# Patient Record
Sex: Female | Born: 1975 | Race: Black or African American | Hispanic: No | Marital: Married | Smoking: Current every day smoker
Health system: Southern US, Community
[De-identification: ages and names within clinical notes are randomized; demographics above are authoritative.]

## PROBLEM LIST (undated history)

## (undated) DIAGNOSIS — R7611 Nonspecific reaction to tuberculin skin test without active tuberculosis: Secondary | ICD-10-CM

## (undated) DIAGNOSIS — N200 Calculus of kidney: Secondary | ICD-10-CM

## (undated) DIAGNOSIS — J42 Unspecified chronic bronchitis: Secondary | ICD-10-CM

## (undated) DIAGNOSIS — J45909 Unspecified asthma, uncomplicated: Secondary | ICD-10-CM

## (undated) DIAGNOSIS — M549 Dorsalgia, unspecified: Secondary | ICD-10-CM

## (undated) DIAGNOSIS — K219 Gastro-esophageal reflux disease without esophagitis: Secondary | ICD-10-CM

## (undated) DIAGNOSIS — M1990 Unspecified osteoarthritis, unspecified site: Secondary | ICD-10-CM

## (undated) DIAGNOSIS — F329 Major depressive disorder, single episode, unspecified: Secondary | ICD-10-CM

## (undated) DIAGNOSIS — M797 Fibromyalgia: Secondary | ICD-10-CM

## (undated) DIAGNOSIS — K8020 Calculus of gallbladder without cholecystitis without obstruction: Secondary | ICD-10-CM

## (undated) DIAGNOSIS — F32A Depression, unspecified: Secondary | ICD-10-CM

## (undated) DIAGNOSIS — G8929 Other chronic pain: Secondary | ICD-10-CM

## (undated) DIAGNOSIS — Z9289 Personal history of other medical treatment: Secondary | ICD-10-CM

## (undated) DIAGNOSIS — G43909 Migraine, unspecified, not intractable, without status migrainosus: Secondary | ICD-10-CM

## (undated) DIAGNOSIS — IMO0002 Reserved for concepts with insufficient information to code with codable children: Secondary | ICD-10-CM

## (undated) DIAGNOSIS — E785 Hyperlipidemia, unspecified: Secondary | ICD-10-CM

## (undated) DIAGNOSIS — I10 Essential (primary) hypertension: Secondary | ICD-10-CM

## (undated) DIAGNOSIS — E119 Type 2 diabetes mellitus without complications: Secondary | ICD-10-CM

## (undated) HISTORY — PX: LITHOTRIPSY: SUR834

## (undated) HISTORY — PX: CERVICAL CERCLAGE: SHX1329

## (undated) HISTORY — PX: WISDOM TOOTH EXTRACTION: SHX21

## (undated) HISTORY — PX: COLONOSCOPY: SHX174

## (undated) HISTORY — PX: ESOPHAGOGASTRODUODENOSCOPY: SHX1529

## (undated) HISTORY — PX: DILATION AND CURETTAGE OF UTERUS: SHX78

## (undated) HISTORY — DX: Calculus of gallbladder without cholecystitis without obstruction: K80.20

---

## 1995-05-03 HISTORY — PX: LAPAROSCOPIC CHOLECYSTECTOMY: SUR755

## 1997-05-02 HISTORY — PX: TUBAL LIGATION: SHX77

## 1997-05-02 HISTORY — PX: CENTRAL VENOUS CATHETER INSERTION: SHX401

## 1997-06-30 HISTORY — PX: CENTRAL VENOUS CATHETER REMOVAL: SHX1323

## 2011-03-04 ENCOUNTER — Emergency Department (HOSPITAL_BASED_OUTPATIENT_CLINIC_OR_DEPARTMENT_OTHER)
Admission: EM | Admit: 2011-03-04 | Discharge: 2011-03-04 | Disposition: A | Discharge status: Home / Self Care | Payer: PRIVATE HEALTH INSURANCE | Attending: Emergency Medicine | Admitting: Emergency Medicine

## 2011-03-04 ENCOUNTER — Emergency Department (INDEPENDENT_AMBULATORY_CARE_PROVIDER_SITE_OTHER): Payer: PRIVATE HEALTH INSURANCE

## 2011-03-04 DIAGNOSIS — S6390XA Sprain of unspecified part of unspecified wrist and hand, initial encounter: Secondary | ICD-10-CM | POA: Insufficient documentation

## 2011-03-04 DIAGNOSIS — S6392XA Sprain of unspecified part of left wrist and hand, initial encounter: Secondary | ICD-10-CM

## 2011-03-04 DIAGNOSIS — Y990 Civilian activity done for income or pay: Secondary | ICD-10-CM | POA: Insufficient documentation

## 2011-03-04 DIAGNOSIS — E119 Type 2 diabetes mellitus without complications: Secondary | ICD-10-CM | POA: Insufficient documentation

## 2011-03-04 DIAGNOSIS — W230XXA Caught, crushed, jammed, or pinched between moving objects, initial encounter: Secondary | ICD-10-CM | POA: Insufficient documentation

## 2011-03-04 DIAGNOSIS — E785 Hyperlipidemia, unspecified: Secondary | ICD-10-CM | POA: Insufficient documentation

## 2011-03-04 DIAGNOSIS — M79609 Pain in unspecified limb: Secondary | ICD-10-CM | POA: Insufficient documentation

## 2011-03-04 DIAGNOSIS — Y9289 Other specified places as the place of occurrence of the external cause: Secondary | ICD-10-CM | POA: Insufficient documentation

## 2011-03-04 DIAGNOSIS — I10 Essential (primary) hypertension: Secondary | ICD-10-CM | POA: Insufficient documentation

## 2011-03-04 DIAGNOSIS — S6990XA Unspecified injury of unspecified wrist, hand and finger(s), initial encounter: Secondary | ICD-10-CM

## 2011-03-04 DIAGNOSIS — X58XXXA Exposure to other specified factors, initial encounter: Secondary | ICD-10-CM

## 2011-03-04 HISTORY — DX: Hyperlipidemia, unspecified: E78.5

## 2011-03-04 HISTORY — DX: Essential (primary) hypertension: I10

## 2011-03-04 MED ORDER — OXYCODONE-ACETAMINOPHEN 5-325 MG PO TABS: 1.0000 | ORAL_TABLET | ORAL | Status: AC | PRN

## 2011-03-04 MED ORDER — OXYCODONE-ACETAMINOPHEN 5-325 MG PO TABS
1.0000 | ORAL_TABLET | Freq: Once | ORAL | Status: AC
  Administered 2011-03-04: 1 via ORAL
  Filled 2011-03-04: qty 1

## 2011-03-04 NOTE — ED Notes (Signed)
MD at bedside. 

## 2011-03-04 NOTE — ED Provider Notes (Signed)
History     CSN: [id number] Arrival date & time: 03/04/2011  7:37 AM   First MD Initiated Contact with Patient 03/04/11 0725      Chief Complaint  Patient presents with  . Hand Injury    (Consider location/radiation/quality/duration/timing/severity/associated sxs/prior treatment) Patient is a 35 y.o. female presenting with hand injury. The history is provided by the patient.  Hand Injury  Incident onset: last night. The incident occurred at work. Injury mechanism: "may have jammed the hand" The pain is present in the left hand. The quality of the pain is described as aching. The pain is moderate. The pain has been constant since the incident.   patient reports that she may have injured her hand last night at work she patient may jammed the hand in the door she reports the pain is getting worse in the past several hours  Past Medical History  Diagnosis Date  . Diabetes mellitus   . Hypertension   . Hyperlipemia     Past Surgical History  Procedure Date  . Peripherally inserted central catheter insertion   . Cervical cerclage   . Cholecystectomy   . Cesarean section   . Tubal ligation     No family history on file.  History  Substance Use Topics  . Smoking status: Current Everyday Smoker -- 0.5 packs/day  . Smokeless tobacco: Never Used  . Alcohol Use: Yes     occasional    OB History    Grav Para Term Preterm Abortions TAB SAB Ect Mult Living                  Review of Systems  Musculoskeletal: Negative for back pain.  Neurological: Negative for weakness.    Allergies  Macrolides and ketolides and Penicillins  Home Medications  No current outpatient prescriptions on file.  BP 156/102  Pulse 83  Temp(Src) 98.3 F (36.8 C) (Oral)  Resp 16  Ht 5\' 1"  (1.549 m)  Wt 168 lb (76.204 kg)  BMI 31.74 kg/m2  SpO2 100%  LMP 02/04/2011  Physical Exam  CONSTITUTIONAL: Well developed/well nourished HEAD AND FACE: Normocephalic/atraumatic EYES: EOMI ENMT:  Mucous membranes moist NECK: supple no meningeal signs LUNGS:, no apparent distress ABDOMEN: soft NEURO: Pt is awake/alert, moves all extremitiesx4, distal motor/sensory intact on left hand EXTREMITIES: pulses normal, full ROM, full ROM of left wrist, she is able to make fist with left hand, no erythema/bruising noted.  Tenderness to dorsal aspect of left hand SKIN: warm, color normal PSYCH: no abnormalities of mood noted   ED Course  Procedures (including critical care time)    MDM  Nursing notes reviewed and considered in documentation xrays reviewed and considered   Re-examine, she has full abd/adduction of left thumb, she can fully flex/extend the left thumb and there is no tenderness and no deformity She is requesting hand splint/buddy tape         Joya Gaskins, MD 03/04/11 [phone]

## 2011-03-04 NOTE — ED Notes (Signed)
Injury to left hand 

## 2011-03-07 NOTE — ED Notes (Signed)
Pt called to clarify return to work date. Assisted with date. No other questions at this time.

## 2011-05-13 DIAGNOSIS — IMO0002 Reserved for concepts with insufficient information to code with codable children: Secondary | ICD-10-CM | POA: Insufficient documentation

## 2011-05-13 DIAGNOSIS — E1165 Type 2 diabetes mellitus with hyperglycemia: Secondary | ICD-10-CM | POA: Insufficient documentation

## 2012-10-30 HISTORY — PX: ABDOMINAL HYSTERECTOMY: SHX81

## 2013-03-13 ENCOUNTER — Emergency Department (HOSPITAL_BASED_OUTPATIENT_CLINIC_OR_DEPARTMENT_OTHER): Payer: Self-pay

## 2013-03-13 ENCOUNTER — Encounter (HOSPITAL_BASED_OUTPATIENT_CLINIC_OR_DEPARTMENT_OTHER): Payer: Self-pay | Admitting: Emergency Medicine

## 2013-03-13 ENCOUNTER — Emergency Department (HOSPITAL_BASED_OUTPATIENT_CLINIC_OR_DEPARTMENT_OTHER)
Admission: EM | Admit: 2013-03-13 | Discharge: 2013-03-13 | Disposition: A | Discharge status: Home / Self Care | Payer: Self-pay | Attending: Emergency Medicine | Admitting: Emergency Medicine

## 2013-03-13 DIAGNOSIS — F17200 Nicotine dependence, unspecified, uncomplicated: Secondary | ICD-10-CM | POA: Insufficient documentation

## 2013-03-13 DIAGNOSIS — E119 Type 2 diabetes mellitus without complications: Secondary | ICD-10-CM | POA: Insufficient documentation

## 2013-03-13 DIAGNOSIS — R0789 Other chest pain: Secondary | ICD-10-CM | POA: Insufficient documentation

## 2013-03-13 DIAGNOSIS — R739 Hyperglycemia, unspecified: Secondary | ICD-10-CM

## 2013-03-13 DIAGNOSIS — Z880 Allergy status to penicillin: Secondary | ICD-10-CM | POA: Insufficient documentation

## 2013-03-13 DIAGNOSIS — R000 Tachycardia, unspecified: Secondary | ICD-10-CM | POA: Insufficient documentation

## 2013-03-13 DIAGNOSIS — Z3202 Encounter for pregnancy test, result negative: Secondary | ICD-10-CM | POA: Insufficient documentation

## 2013-03-13 DIAGNOSIS — Z8719 Personal history of other diseases of the digestive system: Secondary | ICD-10-CM | POA: Insufficient documentation

## 2013-03-13 DIAGNOSIS — R079 Chest pain, unspecified: Secondary | ICD-10-CM

## 2013-03-13 DIAGNOSIS — I10 Essential (primary) hypertension: Secondary | ICD-10-CM | POA: Insufficient documentation

## 2013-03-13 LAB — BASIC METABOLIC PANEL
BUN: 8 mg/dL (ref 6–23)
CO2: 25 mEq/L (ref 19–32)
Calcium: 9.1 mg/dL (ref 8.4–10.5)
Chloride: 96 mEq/L (ref 96–112)
Creatinine, Ser: 0.6 mg/dL (ref 0.50–1.10)
GFR calc Af Amer: >90 mL/min (ref >90)
GFR calc non Af Amer: >90 mL/min (ref >90)
Glucose, Bld: 344 mg/dL — ABNORMAL HIGH (ref 70–99)
Potassium: 3.5 mEq/L (ref 3.5–5.1)
Sodium: 132 mEq/L — ABNORMAL LOW (ref 135–145)

## 2013-03-13 LAB — CBC WITH DIFFERENTIAL/PLATELET
Basophils Absolute: 0 10*3/uL (ref 0.0–0.1)
Basophils Relative: 0 % (ref 0–1)
Eosinophils Absolute: 0.4 10*3/uL (ref 0.0–0.7)
Eosinophils Relative: 4 % (ref 0–5)
HCT: 40.5 % (ref 36.0–46.0)
Hemoglobin: 13.6 g/dL (ref 12.0–15.0)
Lymphocytes Relative: 35 % (ref 12–46)
Lymphs Abs: 3.3 10*3/uL (ref 0.7–4.0)
MCH: 24.3 pg — ABNORMAL LOW (ref 26.0–34.0)
MCHC: 33.6 g/dL (ref 30.0–36.0)
MCV: 72.3 fL — ABNORMAL LOW (ref 78.0–100.0)
Monocytes Absolute: 0.6 10*3/uL (ref 0.1–1.0)
Monocytes Relative: 6 % (ref 3–12)
Neutro Abs: 5.1 10*3/uL (ref 1.7–7.7)
Neutrophils Relative %: 55 % (ref 43–77)
Platelets: 319 10*3/uL (ref 150–400)
RBC: 5.6 MIL/uL — ABNORMAL HIGH (ref 3.87–5.11)
RDW: 14 % (ref 11.5–15.5)
WBC: 9.4 10*3/uL (ref 4.0–10.5)

## 2013-03-13 LAB — TROPONIN I: Troponin I: <0.3 ng/mL (ref <0.30)

## 2013-03-13 LAB — PREGNANCY, URINE: Preg Test, Ur: NEGATIVE

## 2013-03-13 MED ORDER — SODIUM CHLORIDE 0.9 % IV BOLUS (SEPSIS): 1000.0000 mL | Freq: Once | INTRAVENOUS | Status: DC

## 2013-03-13 MED ORDER — MORPHINE SULFATE 4 MG/ML IJ SOLN
4.0000 mg | Freq: Once | INTRAMUSCULAR | Status: AC
  Administered 2013-03-13: 4 mg via INTRAVENOUS
  Filled 2013-03-13: qty 1

## 2013-03-13 MED ORDER — SODIUM CHLORIDE 0.9 % IV BOLUS (SEPSIS)
1000.0000 mL | Freq: Once | INTRAVENOUS | Status: AC
  Administered 2013-03-13: 1000 mL via INTRAVENOUS

## 2013-03-13 MED ORDER — HYDROCODONE-ACETAMINOPHEN 5-325 MG PO TABS: 1.0000 | ORAL_TABLET | ORAL | Status: DC | PRN

## 2013-03-13 MED ORDER — ASPIRIN 81 MG PO CHEW
324.0000 mg | CHEWABLE_TABLET | Freq: Once | ORAL | Status: AC
  Administered 2013-03-13: 324 mg via ORAL
  Filled 2013-03-13: qty 4

## 2013-03-13 MED ORDER — IOHEXOL 350 MG/ML SOLN
80.0000 mL | Freq: Once | INTRAVENOUS | Status: AC | PRN
  Administered 2013-03-13: 80 mL via INTRAVENOUS

## 2013-03-13 NOTE — ED Notes (Signed)
Pt states that she is concerned that she cannot afford the insulin pen her doctor prescribed for her. States her doctor put her on a new "diabetic pill" because she can't afford the insulin pen, and she doesn't feel that it is working as well to control her diabetes. Pt states she cannot afford her copay and deductible to see her pcp again to discuss this. Pt given printed information regarding the [facility] in [place]. Pt states it would be worth the drive to [place] to get some help in managing her diabetes. Pt and husband verbalize understanding of importance of keeping her blood sugars under control.

## 2013-03-13 NOTE — ED Provider Notes (Signed)
CSN: [id number]     Arrival date & time 03/13/13  1049 History   First MD Initiated Contact with Patient 03/13/13 1053     Chief Complaint  Patient presents with  . Chest Pain   (Consider location/radiation/quality/duration/timing/severity/associated sxs/prior Treatment) HPI Comments: 37 year old female presents with about 30 hours of constant chest pressure. States the pain seems to radiate up to her shoulder into her left arm. The pain is severe and constant in nature. It does get worse with inspiration and movement of her left arm. She is also no sig as low but worse with sitting up. Has not been sick recently. Denies any cough or URI symptoms. Had a lobe of intermittent nausea but none currently. No abdominal pain or vomiting. States is not worse with exertion. There is no dyspnea. She has not had any asymmetric leg swelling, hemoptysis, or surgery within the last month. She states her mom has had a history of a blood clot but they do not know why. Has tried NSAIDs without any relief.   Past Medical History  Diagnosis Date  . Diabetes mellitus   . Hypertension   . Hyperlipemia   . Reflux    Past Surgical History  Procedure Laterality Date  . Peripherally inserted central catheter insertion    . Cervical cerclage    . Cholecystectomy    . Cesarean section    . Tubal ligation    . Abdominal hysterectomy     No family history on file. History  Substance Use Topics  . Smoking status: Current Every Day Smoker -- 0.50 packs/day  . Smokeless tobacco: Never Used  . Alcohol Use: Yes     Comment: occasional   OB History   Grav Para Term Preterm Abortions TAB SAB Ect Mult Living                 Review of Systems  Constitutional: Negative for fever, chills and diaphoresis.  HENT: Negative for congestion.   Respiratory: Negative for cough and shortness of breath.   Cardiovascular: Positive for chest pain. Negative for leg swelling.  Gastrointestinal: Negative for vomiting and  abdominal pain.  Musculoskeletal: Negative for back pain.  Neurological: Negative for weakness and numbness.  All other systems reviewed and are negative.    Allergies  Macrolides and ketolides; Penicillins; and Amoxicillin  Home Medications   Current Outpatient Rx  Name  Route  Sig  Dispense  Refill  . UNABLE TO FIND      Med Name: *          BP 140/100  Temp(Src) 98.2 F (36.8 C) (Oral)  Resp 20  SpO2 99%  LMP 02/04/2011 Physical Exam  Nursing note and vitals reviewed. Constitutional: She is oriented to person, place, and time. She appears well-developed and well-nourished. No distress.  HENT:  Head: Normocephalic and atraumatic.  Right Ear: External ear normal.  Left Ear: External ear normal.  Nose: Nose normal.  Mouth/Throat: Oropharynx is clear and moist.  Neck: Neck supple.  Cardiovascular: Regular rhythm, normal heart sounds and intact distal pulses.  Tachycardia present.   Pulses:      Radial pulses are 2+ on the right side, and 2+ on the left side.  Pulmonary/Chest: Effort normal and breath sounds normal. She has no wheezes. She has no rales. She exhibits tenderness.  Abdominal: Soft. She exhibits no distension. There is no tenderness.  Musculoskeletal: She exhibits no edema and no tenderness.  Neurological: She is alert and oriented to person, place,  and time.  Skin: Skin is warm and dry. No rash noted. She is not diaphoretic.    ED Course  Procedures (including critical care time) Labs Review Labs Reviewed  CBC WITH DIFFERENTIAL - Abnormal; Notable for the following:    RBC 5.60 (*)    MCV 72.3 (*)    MCH 24.3 (*)    All other components within normal limits  BASIC METABOLIC PANEL - Abnormal; Notable for the following:    Sodium 132 (*)    Glucose, Bld 344 (*)    All other components within normal limits  TROPONIN I  PREGNANCY, URINE   Imaging Review Dg Chest 2 View  03/13/2013   CLINICAL DATA:  Left chest and left arm discomfort. History  of tobacco use, hypertension, and diabetes.  EXAM: CHEST  2 VIEW  COMPARISON:  July 16, 2010  FINDINGS: The lungs are adequately inflated. There is no focal infiltrate. There are coarse perihilar lung markings on the right which are not entirely new when compared to the study of July 16, 2010. The cardiac silhouette is normal in size. The mediastinum is normal in width. There is no pleural effusion or pneumothorax.  IMPRESSION: There is no evidence of pneumonia or CHF. Right perihilar lung markings are slightly more conspicuous today than in the past. This may reflect subsegmental atelectasis in the appropriate clinical setting.   Electronically Signed   By: David  [place]   On: 03/13/2013 11:58   Ct Angio Chest Pe W/cm &/or Wo Cm  03/13/2013   CLINICAL DATA:  Chest pain and shortness of breath.  EXAM: CT ANGIOGRAPHY CHEST WITH CONTRAST  TECHNIQUE: Multidetector CT imaging of the chest was performed using the standard protocol during bolus administration of intravenous contrast. Multiplanar CT image reconstructions including MIPs were obtained to evaluate the vascular anatomy.  CONTRAST:  80 mL OMNIPAQUE IOHEXOL 350 MG/ML SOLN  COMPARISON:  Plain film of the chest 03/13/2013 11:51 a.m.  FINDINGS: No pulmonary embolus is identified. There is no axillary, hilar or mediastinal lymphadenopathy. Heart size is normal. No pleural or pericardial effusion. Small hiatal hernia is noted. The lungs demonstrate only some mild dependent atelectatic change. Incidentally imaged upper abdomen is unremarkable. No focal bony abnormality is seen.  Review of the MIP images confirms the above findings.  IMPRESSION: Negative for pulmonary embolus.  Negative examination.   Electronically Signed   By: Drusilla Kanner M.D.   On: 03/13/2013 12:49    EKG Interpretation     Ventricular Rate:  120 PR Interval:  142 QRS Duration: 62 QT Interval:  322 QTC Calculation: 455 R Axis:   54 Text Interpretation:  Sinus tachycardia  Cannot rule out Anterior infarct , age undetermined artifact limits interpretation No old tracing to compare            MDM   1. Chest pain   2. Hyperglycemia    Patient's EKG shows no acute ischemia. Repeat EKG also benign. She has a normal troponin, and with over 24 hours of constant symptoms and feels like a was not necessary to rule out MI. Moderate suspicion for PE lead to a CT scan which is negative for PE or other acute chest pathology. Her HEART score is a 3, which puts her in a low risk category. Given that she has risk factors and a nonspecific chest pain I recommended that she needs a stress test within the next few days. I offered observation in the hospital but the patient states if she  can do it outpatient she preferred this. I discussed strict return precautions with the patient and feel that outpatient management is okay at this time given her low-risk status. She understands return precautions and will call her PCP immediately after discharge to set up close outpatient workup.    Audree Camel, MD 03/13/13 1343

## 2013-03-13 NOTE — ED Notes (Signed)
Patient states she woke up yesterday morning at 0630 with left chest pain with radiation into her left arm. Describes pain as pressure and constant.  Using OTC ibuprofen and mylanta with no relief.

## 2013-03-13 NOTE — ED Notes (Signed)
md at bedside, pt to stay in dept. Until second liter of ns hung and infused.

## 2013-03-31 ENCOUNTER — Emergency Department (HOSPITAL_BASED_OUTPATIENT_CLINIC_OR_DEPARTMENT_OTHER)
Admission: EM | Admit: 2013-03-31 | Discharge: 2013-03-31 | Disposition: A | Discharge status: Home / Self Care | Payer: PRIVATE HEALTH INSURANCE | Attending: Emergency Medicine | Admitting: Emergency Medicine

## 2013-03-31 ENCOUNTER — Emergency Department (HOSPITAL_BASED_OUTPATIENT_CLINIC_OR_DEPARTMENT_OTHER): Payer: PRIVATE HEALTH INSURANCE

## 2013-03-31 ENCOUNTER — Encounter (HOSPITAL_BASED_OUTPATIENT_CLINIC_OR_DEPARTMENT_OTHER): Payer: Self-pay | Admitting: Emergency Medicine

## 2013-03-31 DIAGNOSIS — I10 Essential (primary) hypertension: Secondary | ICD-10-CM | POA: Insufficient documentation

## 2013-03-31 DIAGNOSIS — Z794 Long term (current) use of insulin: Secondary | ICD-10-CM | POA: Insufficient documentation

## 2013-03-31 DIAGNOSIS — Z880 Allergy status to penicillin: Secondary | ICD-10-CM | POA: Insufficient documentation

## 2013-03-31 DIAGNOSIS — J029 Acute pharyngitis, unspecified: Secondary | ICD-10-CM | POA: Insufficient documentation

## 2013-03-31 DIAGNOSIS — F17200 Nicotine dependence, unspecified, uncomplicated: Secondary | ICD-10-CM | POA: Insufficient documentation

## 2013-03-31 DIAGNOSIS — IMO0001 Reserved for inherently not codable concepts without codable children: Secondary | ICD-10-CM | POA: Insufficient documentation

## 2013-03-31 DIAGNOSIS — R0789 Other chest pain: Secondary | ICD-10-CM

## 2013-03-31 DIAGNOSIS — Z79899 Other long term (current) drug therapy: Secondary | ICD-10-CM | POA: Insufficient documentation

## 2013-03-31 DIAGNOSIS — R071 Chest pain on breathing: Secondary | ICD-10-CM | POA: Insufficient documentation

## 2013-03-31 DIAGNOSIS — B9789 Other viral agents as the cause of diseases classified elsewhere: Secondary | ICD-10-CM | POA: Insufficient documentation

## 2013-03-31 DIAGNOSIS — E119 Type 2 diabetes mellitus without complications: Secondary | ICD-10-CM | POA: Insufficient documentation

## 2013-03-31 DIAGNOSIS — Z8719 Personal history of other diseases of the digestive system: Secondary | ICD-10-CM | POA: Insufficient documentation

## 2013-03-31 DIAGNOSIS — B349 Viral infection, unspecified: Secondary | ICD-10-CM

## 2013-03-31 LAB — CBC WITH DIFFERENTIAL/PLATELET
Basophils Absolute: 0 10*3/uL (ref 0.0–0.1)
Basophils Relative: 0 % (ref 0–1)
Eosinophils Absolute: 0.9 10*3/uL — ABNORMAL HIGH (ref 0.0–0.7)
Eosinophils Relative: 12 % — ABNORMAL HIGH (ref 0–5)
HCT: 40.2 % (ref 36.0–46.0)
Hemoglobin: 13.3 g/dL (ref 12.0–15.0)
Lymphocytes Relative: 30 % (ref 12–46)
Lymphs Abs: 2.3 10*3/uL (ref 0.7–4.0)
MCH: 23.9 pg — ABNORMAL LOW (ref 26.0–34.0)
MCHC: 33.1 g/dL (ref 30.0–36.0)
MCV: 72.3 fL — ABNORMAL LOW (ref 78.0–100.0)
Monocytes Absolute: 0.7 10*3/uL (ref 0.1–1.0)
Monocytes Relative: 9 % (ref 3–12)
Neutro Abs: 3.9 10*3/uL (ref 1.7–7.7)
Neutrophils Relative %: 49 % (ref 43–77)
Platelets: 368 10*3/uL (ref 150–400)
RBC: 5.56 MIL/uL — ABNORMAL HIGH (ref 3.87–5.11)
RDW: 13.8 % (ref 11.5–15.5)
WBC: 7.8 10*3/uL (ref 4.0–10.5)

## 2013-03-31 LAB — BASIC METABOLIC PANEL
BUN: 7 mg/dL (ref 6–23)
CO2: 23 mEq/L (ref 19–32)
Calcium: 9 mg/dL (ref 8.4–10.5)
Chloride: 101 mEq/L (ref 96–112)
Creatinine, Ser: 0.6 mg/dL (ref 0.50–1.10)
GFR calc Af Amer: >90 mL/min (ref >90)
GFR calc non Af Amer: >90 mL/min (ref >90)
Glucose, Bld: 164 mg/dL — ABNORMAL HIGH (ref 70–99)
Potassium: 4.4 mEq/L (ref 3.5–5.1)
Sodium: 136 mEq/L (ref 135–145)

## 2013-03-31 LAB — TROPONIN I: Troponin I: <0.3 ng/mL (ref <0.30)

## 2013-03-31 MED ORDER — ACETAMINOPHEN 325 MG PO TABS
650.0000 mg | ORAL_TABLET | Freq: Once | ORAL | Status: AC
  Administered 2013-03-31: 650 mg via ORAL
  Filled 2013-03-31: qty 2

## 2013-03-31 MED ORDER — SODIUM CHLORIDE 0.9 % IV SOLN
1000.0000 mL | Freq: Once | INTRAVENOUS | Status: AC
  Administered 2013-03-31: 1000 mL via INTRAVENOUS

## 2013-03-31 NOTE — ED Notes (Signed)
Patient reports cough and congestion x 4 days. States that her chest hurst with any movement and cough. Describes as productive with green sputum. Low grade fever for same

## 2013-04-04 NOTE — ED Provider Notes (Signed)
CSN: [id number]     Arrival date & time 03/31/13  1025 History   First MD Initiated Contact with Patient 03/31/13 1057     Chief Complaint  Patient presents with  . Cough  . Nasal Congestion  . Chest Pain   (Consider location/radiation/quality/duration/timing/severity/associated sxs/prior Treatment) Patient is a 37 y.o. female presenting with cough and chest pain. The history is provided by the patient. No language interpreter was used.  Cough Cough characteristics:  Non-productive Severity:  Moderate Onset quality:  Gradual Duration:  2 days Timing:  Intermittent Progression:  Unchanged Chronicity:  New Smoker: no   Context: sick contacts   Context: not animal exposure, not exposure to allergens and not fumes   Context comment:  Daughter with similar symptoms in department.  Relieved by:  None tried Ineffective treatments:  None tried Associated symptoms: chest pain, chills, fever, headaches, myalgias, rhinorrhea, sinus congestion and sore throat   Associated symptoms: no diaphoresis, no eye discharge, no rash, no shortness of breath, no weight loss and no wheezing   Chest pain:    Quality:  Sharp   Severity:  Moderate   Onset quality:  Gradual (Pain occures with coughing. )   Chronicity:  New Risk factors: no chemical exposure and no recent travel   Chest Pain Associated symptoms: cough, fever and headache   Associated symptoms: no diaphoresis and no shortness of breath     Past Medical History  Diagnosis Date  . Diabetes mellitus   . Hypertension   . Hyperlipemia   . Reflux    Past Surgical History  Procedure Laterality Date  . Peripherally inserted central catheter insertion    . Cervical cerclage    . Cholecystectomy    . Cesarean section    . Tubal ligation    . Abdominal hysterectomy     No family history on file. History  Substance Use Topics  . Smoking status: Current Every Day Smoker -- 0.50 packs/day  . Smokeless tobacco: Never Used  . Alcohol  Use: Yes     Comment: occasional   OB History   Grav Para Term Preterm Abortions TAB SAB Ect Mult Living                 Review of Systems  Constitutional: Positive for fever and chills. Negative for weight loss and diaphoresis.  HENT: Positive for rhinorrhea and sore throat.   Eyes: Negative for discharge.  Respiratory: Positive for cough. Negative for shortness of breath and wheezing.   Cardiovascular: Positive for chest pain.  Musculoskeletal: Positive for myalgias.  Skin: Negative for rash.  Neurological: Positive for headaches.  All other systems reviewed and are negative.    Allergies  Macrolides and ketolides; Penicillins; and Amoxicillin  Home Medications   Current Outpatient Rx  Name  Route  Sig  Dispense  Refill  . insulin NPH-regular (NOVOLIN 70/30) (70-30) 100 UNIT/ML injection   Subcutaneous   Inject 10 Units into the skin 2 (two) times daily with a meal.         . lisinopril (PRINIVIL,ZESTRIL) 20 MG tablet   Oral   Take 20 mg by mouth daily.          BP 127/83  Pulse 79  Temp(Src) 97.6 F (36.4 C) (Oral)  Resp 22  Wt 150 lb (68.04 kg)  SpO2 100%  LMP 02/04/2011 Physical Exam  Nursing note and vitals reviewed. Constitutional: She is oriented to person, place, and time. She appears well-developed and well-nourished.  HENT:  Head: Normocephalic and atraumatic.  Right Ear: External ear normal.  Left Ear: External ear normal.  Nose: Nose normal.  Mouth/Throat: Oropharynx is clear and moist.  Eyes: Conjunctivae and EOM are normal. Pupils are equal, round, and reactive to light.  Neck: Normal range of motion. Neck supple. No JVD present. No tracheal deviation present. No thyromegaly present.  Cardiovascular: Normal rate, regular rhythm, normal heart sounds and intact distal pulses.   Chest ttp lower sternum  Pulmonary/Chest: Effort normal and breath sounds normal. No respiratory distress. She has no wheezes.  Abdominal: Soft. Bowel sounds are  normal. She exhibits no mass. There is no tenderness. There is no guarding.  Musculoskeletal: Normal range of motion.  Lymphadenopathy:    She has no cervical adenopathy.  Neurological: She is alert and oriented to person, place, and time. She has normal reflexes. No cranial nerve deficit or sensory deficit. Gait normal. GCS eye subscore is 4. GCS verbal subscore is 5. GCS motor subscore is 6.  Reflex Scores:      Bicep reflexes are 2+ on the right side and 2+ on the left side.      Patellar reflexes are 2+ on the right side and 2+ on the left side. Strength is 5/5 bilateral elbow flexor/extensors, wrist extension/flexion, intrinsic hand strength equal Bilateral hip flexion/extension 5/5, knee flexion/extension 5/5, ankle 5/5 flexion extension    Skin: Skin is warm and dry.  Psychiatric: She has a normal mood and affect. Her behavior is normal. Judgment and thought content normal.    ED Course  Procedures (including critical care time) Labs Review Labs Reviewed  CBC WITH DIFFERENTIAL - Abnormal; Notable for the following:    RBC 5.56 (*)    MCV 72.3 (*)    MCH 23.9 (*)    Eosinophils Relative 12 (*)    Eosinophils Absolute 0.9 (*)    All other components within normal limits  BASIC METABOLIC PANEL - Abnormal; Notable for the following:    Glucose, Bld 164 (*)    All other components within normal limits  TROPONIN I   Imaging Review No results found.  EKG Interpretation    Date/Time:  Sunday March 31 2013 11:09:39 EST Ventricular Rate:  97 PR Interval:  150 QRS Duration: 78 QT Interval:  362 QTC Calculation: 459 R Axis:   72 Text Interpretation:  Normal sinus rhythm Normal ECG Confirmed by Keyly Baldonado MD, Malosi Hemstreet ([id number]) on 03/31/2013 11:12:54 AM            MDM   1. Viral syndrome   2. Chest wall pain    Patient is a 37 y.o. iddm present today with viral syndrome and chest wall pain.  Labs normal except bs elevated at 164.  No evidence of dka.  Patient hydrated  and assessed with labs, ekg, and cxr without evidence of mi or cap.     Hilario Quarry, MD 04/04/13 1226

## 2013-08-29 ENCOUNTER — Emergency Department (HOSPITAL_BASED_OUTPATIENT_CLINIC_OR_DEPARTMENT_OTHER)
Admission: EM | Admit: 2013-08-29 | Discharge: 2013-08-29 | Disposition: A | Discharge status: Home / Self Care | Payer: Medicaid Other | Attending: Emergency Medicine | Admitting: Emergency Medicine

## 2013-08-29 ENCOUNTER — Encounter (HOSPITAL_BASED_OUTPATIENT_CLINIC_OR_DEPARTMENT_OTHER): Payer: Self-pay | Admitting: Emergency Medicine

## 2013-08-29 DIAGNOSIS — Z880 Allergy status to penicillin: Secondary | ICD-10-CM | POA: Insufficient documentation

## 2013-08-29 DIAGNOSIS — Z79899 Other long term (current) drug therapy: Secondary | ICD-10-CM | POA: Insufficient documentation

## 2013-08-29 DIAGNOSIS — E119 Type 2 diabetes mellitus without complications: Secondary | ICD-10-CM | POA: Insufficient documentation

## 2013-08-29 DIAGNOSIS — K219 Gastro-esophageal reflux disease without esophagitis: Secondary | ICD-10-CM | POA: Insufficient documentation

## 2013-08-29 DIAGNOSIS — Z794 Long term (current) use of insulin: Secondary | ICD-10-CM | POA: Insufficient documentation

## 2013-08-29 DIAGNOSIS — IMO0002 Reserved for concepts with insufficient information to code with codable children: Secondary | ICD-10-CM | POA: Insufficient documentation

## 2013-08-29 DIAGNOSIS — IMO0001 Reserved for inherently not codable concepts without codable children: Secondary | ICD-10-CM | POA: Insufficient documentation

## 2013-08-29 DIAGNOSIS — F17200 Nicotine dependence, unspecified, uncomplicated: Secondary | ICD-10-CM | POA: Insufficient documentation

## 2013-08-29 DIAGNOSIS — I10 Essential (primary) hypertension: Secondary | ICD-10-CM | POA: Insufficient documentation

## 2013-08-29 DIAGNOSIS — G8929 Other chronic pain: Secondary | ICD-10-CM | POA: Insufficient documentation

## 2013-08-29 DIAGNOSIS — M5410 Radiculopathy, site unspecified: Secondary | ICD-10-CM

## 2013-08-29 HISTORY — DX: Fibromyalgia: M79.7

## 2013-08-29 HISTORY — DX: Reserved for concepts with insufficient information to code with codable children: IMO0002

## 2013-08-29 MED ORDER — KETOROLAC TROMETHAMINE 60 MG/2ML IM SOLN
60.0000 mg | Freq: Once | INTRAMUSCULAR | Status: AC
  Administered 2013-08-29: 60 mg via INTRAMUSCULAR
  Filled 2013-08-29: qty 2

## 2013-08-29 MED ORDER — PREDNISONE 10 MG PO TABS: 20.0000 mg | ORAL_TABLET | Freq: Two times a day (BID) | ORAL | Status: DC

## 2013-08-29 MED ORDER — HYDROCODONE-ACETAMINOPHEN 5-325 MG PO TABS
2.0000 | ORAL_TABLET | Freq: Once | ORAL | Status: AC
Start: 2013-08-29 — End: 2013-08-29
  Administered 2013-08-29: 2 via ORAL
  Filled 2013-08-29: qty 2

## 2013-08-29 MED ORDER — HYDROCODONE-ACETAMINOPHEN 5-325 MG PO TABS: 2.0000 | ORAL_TABLET | ORAL | Status: DC | PRN

## 2013-08-29 NOTE — ED Provider Notes (Signed)
CSN: [id number]     Arrival date & time 08/29/13  1645 History   First MD Initiated Contact with Patient 08/29/13 1705     Chief Complaint  Patient presents with  . Back Pain     (Consider location/radiation/quality/duration/timing/severity/associated sxs/prior Treatment) HPI Comments: Patient is a 38 year old female with history of chronic low back pain and fibromyalgia. She presents today with complaints of worsening back pain radiating down her right thigh. She had an MRI performed 2 months ago which revealed mild disc bulging and mild foraminal narrowing at L4-5 and L5-S1. She has an appointment with a specialist coming up next month. Patient presents now complaining that her pain is not controlled with the medications prescribed by her primary Dr. She denies any bowel or bladder complaints. She denies any weakness in her leg.  Patient is a 38 y.o. female presenting with back pain. The history is provided by the patient.  Back Pain Location:  Lumbar spine Quality:  Stabbing Radiates to:  R posterior upper leg Pain severity:  Severe Pain is:  Same all the time Timing:  Constant Progression:  Unchanged   Past Medical History  Diagnosis Date  . Diabetes mellitus   . Hypertension   . Hyperlipemia   . Reflux   . Fibromyalgia   . Degenerative disc disease    Past Surgical History  Procedure Laterality Date  . Peripherally inserted central catheter insertion    . Cervical cerclage    . Cholecystectomy    . Cesarean section    . Tubal ligation    . Abdominal hysterectomy     No family history on file. History  Substance Use Topics  . Smoking status: Current Every Day Smoker -- 0.50 packs/day  . Smokeless tobacco: Never Used  . Alcohol Use: Yes     Comment: occasional   OB History   Grav Para Term Preterm Abortions TAB SAB Ect Mult Living                 Review of Systems  Musculoskeletal: Positive for back pain.  All other systems reviewed and are  negative.     Allergies  Macrolides and ketolides; Penicillins; Sulfa antibiotics; and Amoxicillin  Home Medications   Prior to Admission medications   Medication Sig Start Date End Date Taking? Authorizing Provider  estradiol (ESTRACE) 1 MG tablet Take 1 mg by mouth daily.   Yes Historical Provider, MD  gabapentin (NEURONTIN) 300 MG capsule Take 300 mg by mouth 3 (three) times daily.   Yes Historical Provider, MD  methocarbamol (ROBAXIN) 500 MG tablet Take 500 mg by mouth every 8 (eight) hours as needed for muscle spasms.   Yes Historical Provider, MD  omeprazole (PRILOSEC) 20 MG capsule Take 20 mg by mouth daily.   Yes Historical Provider, MD  solifenacin (VESICARE) 5 MG tablet Take 5 mg by mouth daily.   Yes Historical Provider, MD  insulin NPH-regular (NOVOLIN 70/30) (70-30) 100 UNIT/ML injection Inject 10 Units into the skin 2 (two) times daily with a meal.    Historical Provider, MD  lisinopril (PRINIVIL,ZESTRIL) 20 MG tablet Take 20 mg by mouth daily.    Historical Provider, MD   BP 146/100  Pulse 103  Temp(Src) 99 F (37.2 C) (Oral)  Resp 16  Ht 5\' 4"  (1.626 m)  Wt 164 lb (74.39 kg)  BMI 28.14 kg/m2  SpO2 100%  LMP 02/04/2011 Physical Exam  Nursing note and vitals reviewed. Constitutional: She is oriented to person, place, and  time. She appears well-developed and well-nourished. No distress.  HENT:  Head: Normocephalic and atraumatic.  Neck: Normal range of motion. Neck supple.  Musculoskeletal: Normal range of motion.  There is tenderness to palpation in the soft tissues of the lower lumbar region. There is no bony tenderness and no step-off.  Neurological: She is alert and oriented to person, place, and time.  Achilles and patellar reflexes are trace, equal, and symmetrical. Strength is 5 out of 5 in both lower extremities and she is able to ambulate without difficulty.  Skin: Skin is warm and dry. She is not diaphoretic.    ED Course  Procedures (including  critical care time) Labs Review Labs Reviewed - No data to display  Imaging Review No results found.   EKG Interpretation None      MDM   Final diagnoses:  None    She will be given Toradol and hydrocodone. She will be discharged with prednisone and hydrocodone. There is nothing on the physical exam or MRI that indicates an emergent cause of her discomfort and I do not feel as though any further imaging or workup is necessary. She is to followup with the physician she was referred to by her primary Dr.    Veryl Speak, MD 08/29/13 [phone]

## 2013-08-29 NOTE — Discharge Instructions (Signed)
Prednisone as prescribed.  Hydrocodone as prescribed as needed for pain.  Followup with your primary Dr. if not improving in the next week.   Back Pain, Adult Back pain is very common. The pain often gets better over time. The cause of back pain is usually not dangerous. Most people can learn to manage their back pain on their own.  HOME CARE   Stay active. Start with short walks on flat ground if you can. Try to walk farther each day.  Do not sit, drive, or stand in one place for more than 30 minutes. Do not stay in bed.  Do not avoid exercise or work. Activity can help your back heal faster.  Be careful when you bend or lift an object. Bend at your knees, keep the object close to you, and do not twist.  Sleep on a firm mattress. Lie on your side, and bend your knees. If you lie on your back, put a pillow under your knees.  Only take medicines as told by your doctor.  Put ice on the injured area.  Put ice in a plastic bag.  Place a towel between your skin and the bag.  Leave the ice on for 15-20 minutes, 03-04 times a day for the first 2 to 3 days. After that, you can switch between ice and heat packs.  Ask your doctor about back exercises or massage.  Avoid feeling anxious or stressed. Find good ways to deal with stress, such as exercise. GET HELP RIGHT AWAY IF:   Your pain does not go away with rest or medicine.  Your pain does not go away in 1 week.  You have new problems.  You do not feel well.  The pain spreads into your legs.  You cannot control when you poop (bowel movement) or pee (urinate).  Your arms or legs feel weak or lose feeling (numbness).  You feel sick to your stomach (nauseous) or throw up (vomit).  You have belly (abdominal) pain.  You feel like you may pass out (faint). MAKE SURE YOU:   Understand these instructions.  Will watch your condition.  Will get help right away if you are not doing well or get worse. Document Released:  10/05/2007 Document Revised: 07/11/2011 Document Reviewed: 09/06/2010 [facility] Patient Information 2014 [place].

## 2013-08-29 NOTE — ED Notes (Signed)
Pt has chronic back and leg pain.  Has appt with Rheumatologist in June but sts the med her pmd has given her are not working and she can't stand the pain.  Had MRI at [facility] 2 weeks ago.

## 2014-01-21 DIAGNOSIS — M533 Sacrococcygeal disorders, not elsewhere classified: Secondary | ICD-10-CM | POA: Insufficient documentation

## 2014-01-21 DIAGNOSIS — M1990 Unspecified osteoarthritis, unspecified site: Secondary | ICD-10-CM | POA: Insufficient documentation

## 2014-03-19 ENCOUNTER — Emergency Department (HOSPITAL_BASED_OUTPATIENT_CLINIC_OR_DEPARTMENT_OTHER): Payer: Medicaid Other

## 2014-03-19 ENCOUNTER — Encounter (HOSPITAL_BASED_OUTPATIENT_CLINIC_OR_DEPARTMENT_OTHER): Payer: Self-pay | Admitting: *Deleted

## 2014-03-19 ENCOUNTER — Inpatient Hospital Stay (HOSPITAL_BASED_OUTPATIENT_CLINIC_OR_DEPARTMENT_OTHER)
Admission: EM | Admit: 2014-03-19 | Discharge: 2014-03-21 | DRG: 103 | Disposition: A | Discharge status: Home / Self Care | Payer: Medicaid Other | Attending: Internal Medicine | Admitting: Internal Medicine

## 2014-03-19 DIAGNOSIS — G894 Chronic pain syndrome: Secondary | ICD-10-CM | POA: Diagnosis present

## 2014-03-19 DIAGNOSIS — D72829 Elevated white blood cell count, unspecified: Secondary | ICD-10-CM | POA: Diagnosis present

## 2014-03-19 DIAGNOSIS — G932 Benign intracranial hypertension: Secondary | ICD-10-CM | POA: Diagnosis present

## 2014-03-19 DIAGNOSIS — R42 Dizziness and giddiness: Secondary | ICD-10-CM

## 2014-03-19 DIAGNOSIS — F329 Major depressive disorder, single episode, unspecified: Secondary | ICD-10-CM | POA: Diagnosis present

## 2014-03-19 DIAGNOSIS — Z87442 Personal history of urinary calculi: Secondary | ICD-10-CM

## 2014-03-19 DIAGNOSIS — Z882 Allergy status to sulfonamides status: Secondary | ICD-10-CM

## 2014-03-19 DIAGNOSIS — Z794 Long term (current) use of insulin: Secondary | ICD-10-CM

## 2014-03-19 DIAGNOSIS — E1140 Type 2 diabetes mellitus with diabetic neuropathy, unspecified: Secondary | ICD-10-CM | POA: Diagnosis present

## 2014-03-19 DIAGNOSIS — I10 Essential (primary) hypertension: Secondary | ICD-10-CM

## 2014-03-19 DIAGNOSIS — G43901 Migraine, unspecified, not intractable, with status migrainosus: Principal | ICD-10-CM | POA: Diagnosis present

## 2014-03-19 DIAGNOSIS — M1990 Unspecified osteoarthritis, unspecified site: Secondary | ICD-10-CM | POA: Diagnosis present

## 2014-03-19 DIAGNOSIS — E785 Hyperlipidemia, unspecified: Secondary | ICD-10-CM

## 2014-03-19 DIAGNOSIS — M549 Dorsalgia, unspecified: Secondary | ICD-10-CM | POA: Diagnosis present

## 2014-03-19 DIAGNOSIS — K219 Gastro-esophageal reflux disease without esophagitis: Secondary | ICD-10-CM | POA: Diagnosis present

## 2014-03-19 DIAGNOSIS — Z881 Allergy status to other antibiotic agents status: Secondary | ICD-10-CM

## 2014-03-19 DIAGNOSIS — T380X5A Adverse effect of glucocorticoids and synthetic analogues, initial encounter: Secondary | ICD-10-CM | POA: Diagnosis present

## 2014-03-19 DIAGNOSIS — Z880 Allergy status to penicillin: Secondary | ICD-10-CM

## 2014-03-19 DIAGNOSIS — G44011 Episodic cluster headache, intractable: Secondary | ICD-10-CM

## 2014-03-19 DIAGNOSIS — R51 Headache: Secondary | ICD-10-CM

## 2014-03-19 DIAGNOSIS — Z7952 Long term (current) use of systemic steroids: Secondary | ICD-10-CM

## 2014-03-19 DIAGNOSIS — Z6831 Body mass index (BMI) 31.0-31.9, adult: Secondary | ICD-10-CM

## 2014-03-19 DIAGNOSIS — E119 Type 2 diabetes mellitus without complications: Secondary | ICD-10-CM

## 2014-03-19 DIAGNOSIS — F17210 Nicotine dependence, cigarettes, uncomplicated: Secondary | ICD-10-CM | POA: Diagnosis present

## 2014-03-19 DIAGNOSIS — R519 Headache, unspecified: Secondary | ICD-10-CM | POA: Diagnosis present

## 2014-03-19 DIAGNOSIS — J42 Unspecified chronic bronchitis: Secondary | ICD-10-CM | POA: Diagnosis present

## 2014-03-19 DIAGNOSIS — J45909 Unspecified asthma, uncomplicated: Secondary | ICD-10-CM | POA: Diagnosis present

## 2014-03-19 DIAGNOSIS — M797 Fibromyalgia: Secondary | ICD-10-CM | POA: Diagnosis present

## 2014-03-19 DIAGNOSIS — G4730 Sleep apnea, unspecified: Secondary | ICD-10-CM | POA: Diagnosis present

## 2014-03-19 DIAGNOSIS — E669 Obesity, unspecified: Secondary | ICD-10-CM | POA: Diagnosis present

## 2014-03-19 HISTORY — DX: Depression, unspecified: F32.A

## 2014-03-19 HISTORY — DX: Unspecified asthma, uncomplicated: J45.909

## 2014-03-19 HISTORY — DX: Migraine, unspecified, not intractable, without status migrainosus: G43.909

## 2014-03-19 HISTORY — DX: Dorsalgia, unspecified: M54.9

## 2014-03-19 HISTORY — DX: Major depressive disorder, single episode, unspecified: F32.9

## 2014-03-19 HISTORY — DX: Personal history of other medical treatment: Z92.89

## 2014-03-19 HISTORY — DX: Other chronic pain: G89.29

## 2014-03-19 HISTORY — DX: Unspecified chronic bronchitis: J42

## 2014-03-19 HISTORY — DX: Type 2 diabetes mellitus without complications: E11.9

## 2014-03-19 HISTORY — DX: Nonspecific reaction to tuberculin skin test without active tuberculosis: R76.11

## 2014-03-19 HISTORY — DX: Calculus of kidney: N20.0

## 2014-03-19 HISTORY — DX: Gastro-esophageal reflux disease without esophagitis: K21.9

## 2014-03-19 HISTORY — DX: Unspecified osteoarthritis, unspecified site: M19.90

## 2014-03-19 LAB — BASIC METABOLIC PANEL
Anion gap: 10 (ref 5–15)
BUN: 7 mg/dL (ref 6–23)
CO2: 25 mEq/L (ref 19–32)
Calcium: 9.4 mg/dL (ref 8.4–10.5)
Chloride: 100 mEq/L (ref 96–112)
Creatinine, Ser: 0.6 mg/dL (ref 0.50–1.10)
GFR calc Af Amer: >90 mL/min (ref >90)
GFR calc non Af Amer: >90 mL/min (ref >90)
Glucose, Bld: 121 mg/dL — ABNORMAL HIGH (ref 70–99)
Potassium: 4.5 mEq/L (ref 3.7–5.3)
Sodium: 135 mEq/L — ABNORMAL LOW (ref 137–147)

## 2014-03-19 LAB — URINALYSIS, ROUTINE W REFLEX MICROSCOPIC
Bilirubin Urine: NEGATIVE
Glucose, UA: 500 mg/dL — AB
Hgb urine dipstick: NEGATIVE
Ketones, ur: NEGATIVE mg/dL
Leukocytes, UA: NEGATIVE
Nitrite: NEGATIVE
Protein, ur: NEGATIVE mg/dL
Specific Gravity, Urine: 1.01 (ref 1.005–1.030)
Urobilinogen, UA: 0.2 mg/dL (ref 0.0–1.0)
pH: 7.5 (ref 5.0–8.0)

## 2014-03-19 LAB — CBC WITH DIFFERENTIAL/PLATELET
Basophils Absolute: 0 10*3/uL (ref 0.0–0.1)
Basophils Relative: 0 % (ref 0–1)
Eosinophils Absolute: 0.7 10*3/uL (ref 0.0–0.7)
Eosinophils Relative: 7 % — ABNORMAL HIGH (ref 0–5)
HCT: 41.5 % (ref 36.0–46.0)
Hemoglobin: 13.9 g/dL (ref 12.0–15.0)
Lymphocytes Relative: 53 % — ABNORMAL HIGH (ref 12–46)
Lymphs Abs: 5.1 10*3/uL — ABNORMAL HIGH (ref 0.7–4.0)
MCH: 23.9 pg — ABNORMAL LOW (ref 26.0–34.0)
MCHC: 33.5 g/dL (ref 30.0–36.0)
MCV: 71.4 fL — ABNORMAL LOW (ref 78.0–100.0)
Monocytes Absolute: 0.9 10*3/uL (ref 0.1–1.0)
Monocytes Relative: 9 % (ref 3–12)
Neutro Abs: 3 10*3/uL (ref 1.7–7.7)
Neutrophils Relative %: 31 % — ABNORMAL LOW (ref 43–77)
Platelets: 366 10*3/uL (ref 150–400)
RBC: 5.81 MIL/uL — ABNORMAL HIGH (ref 3.87–5.11)
RDW: 14.5 % (ref 11.5–15.5)
WBC: 9.7 10*3/uL (ref 4.0–10.5)

## 2014-03-19 LAB — TROPONIN I: Troponin I: <0.3 ng/mL (ref <0.30)

## 2014-03-19 LAB — POC URINE PREG, ED: Preg Test, Ur: NEGATIVE

## 2014-03-19 LAB — CBG MONITORING, ED: Glucose-Capillary: 118 mg/dL — ABNORMAL HIGH (ref 70–99)

## 2014-03-19 MED ORDER — SODIUM CHLORIDE 0.9 % IV SOLN
INTRAVENOUS | Status: DC
  Administered 2014-03-19: 13:00:00 via INTRAVENOUS

## 2014-03-19 MED ORDER — PROMETHAZINE HCL 25 MG/ML IJ SOLN
12.5000 mg | Freq: Once | INTRAMUSCULAR | Status: AC
  Administered 2014-03-19: 12.5 mg via INTRAVENOUS
  Filled 2014-03-19: qty 1

## 2014-03-19 MED ORDER — METOCLOPRAMIDE HCL 5 MG/ML IJ SOLN
10.0000 mg | Freq: Once | INTRAMUSCULAR | Status: AC
  Administered 2014-03-19: 10 mg via INTRAVENOUS
  Filled 2014-03-19: qty 2

## 2014-03-19 MED ORDER — HYDROMORPHONE HCL 1 MG/ML IJ SOLN
0.5000 mg | Freq: Once | INTRAMUSCULAR | Status: AC
  Administered 2014-03-19: 0.5 mg via INTRAVENOUS
  Filled 2014-03-19: qty 1

## 2014-03-19 MED ORDER — SODIUM CHLORIDE 0.9 % IV BOLUS (SEPSIS)
1000.0000 mL | Freq: Once | INTRAVENOUS | Status: AC
  Administered 2014-03-19: 1000 mL via INTRAVENOUS

## 2014-03-19 MED ORDER — DIPHENHYDRAMINE HCL 50 MG/ML IJ SOLN
25.0000 mg | Freq: Once | INTRAMUSCULAR | Status: AC
  Administered 2014-03-19: 25 mg via INTRAVENOUS
  Filled 2014-03-19: qty 1

## 2014-03-19 MED ORDER — DEXAMETHASONE SODIUM PHOSPHATE 10 MG/ML IJ SOLN
10.0000 mg | Freq: Once | INTRAMUSCULAR | Status: AC
  Administered 2014-03-19: 10 mg via INTRAVENOUS
  Filled 2014-03-19: qty 1

## 2014-03-19 MED ORDER — KETOROLAC TROMETHAMINE 30 MG/ML IJ SOLN
30.0000 mg | Freq: Once | INTRAMUSCULAR | Status: AC
  Administered 2014-03-19: 30 mg via INTRAVENOUS
  Filled 2014-03-19: qty 1

## 2014-03-19 MED ORDER — VALPROATE SODIUM 500 MG/5ML IV SOLN
1000.0000 mg | Freq: Once | INTRAVENOUS | Status: AC
  Administered 2014-03-19: 1000 mg via INTRAVENOUS
  Filled 2014-03-19: qty 10

## 2014-03-19 NOTE — ED Notes (Signed)
Pt is asymptomatic and tachycardic at 130s

## 2014-03-19 NOTE — ED Notes (Signed)
Pt given [place] sandwich and diet coke to drink

## 2014-03-19 NOTE — ED Notes (Signed)
Marissa, PA aware of tachycardia. Repeat ekg given to Dr.Harrison

## 2014-03-19 NOTE — ED Provider Notes (Signed)
Danielle Warren is a 38 year old female with past medical history of diabetes, hyperlipidemia, hypertension, reflux, degenerative disc disease, fibromyalgia presenting to emergency department with headache that started this morning. Patient reported that when she woke up she started to experience a headache localized to the temporal region of her head described as a throbbing sensation that progressively gotten worse brought the day. Reported that she feels a pressure sensation in her frontal aspect as a [place] hitting her with hammer. Reported associated nausea. Stated that she does have history of headaches, was diagnosed when she was a years of age, reports that this is the worst headache that she has ever experienced. Patient reported that she has been experiencing issues with vision, reported blurred vision intermittently to bilateral eyes-did explain that she experience loss of vision to the left eye for split second. Stated that she's been having some weakness to left upper and left lower extremities. Reported that she normally has issues with gait secondary to her diabetic neuropathy, reported that today her gait is gone progressively worse-feels unsteady when standing and when walking. Patient was seen and assessed in ED setting at bedside to [facility] where patient was transferred to [facility] for neurology to come and assess patient. Dr. Leonel Ramsay was consultative regarding this Patient [facility] [place]. Denied fever, neck stiffness, vomiting, diarrhea, chest pain, shortness of breath, difficulty breathing, loss of memory, head injury, fall, travel, leg swelling. Denied history of TBI concussion. Patient has history of hysterectomy - denied use of birth control.  PCP Dr. Trilby Drummer Neurology Dr. Fernanda Drum   Physical Exam  BP 114/66 mmHg  Pulse 137  Temp(Src) 98 F (36.7 C) (Oral)  Resp 16  Ht 5' 1"  (1.549 m)  Wt 164 lb (74.39 kg)  BMI 31.00 kg/m2  SpO2 98%  LMP 02/04/2011  Physical Exam   Constitutional: She is oriented to person, place, and time. She appears well-developed and well-nourished. No distress.  HENT:  Head: Normocephalic and atraumatic.  Eyes: Conjunctivae and EOM are normal. Pupils are equal, round, and reactive to light. Right eye exhibits no discharge. Left eye exhibits no discharge.  Negative nystagmus EOMs intact with negative signs of entrapment  Neck: Normal range of motion. Neck supple. No tracheal deviation present.  Cardiovascular: Regular rhythm and normal heart sounds.  Tachycardia present.   Pulses:      Radial pulses are 2+ on the right side, and 2+ on the left side.       Dorsalis pedis pulses are 2+ on the right side, and 2+ on the left side.  Tachycardia upon auscultation  Pulmonary/Chest: Effort normal and breath sounds normal. No respiratory distress. She has no wheezes. She has no rales.  Musculoskeletal: Normal range of motion.  Mild decreased ROM noted to the RLE and RUE  Lymphadenopathy:    She has no cervical adenopathy.  Neurological: She is alert and oriented to person, place, and time. No cranial nerve deficit. She exhibits normal muscle tone. Coordination normal.  Cranial nerves III-XII grossly intact Strength 4+/5+ to right upper and lower extremities bilaterally with resistance applied, equal distribution noted Mildly decreased grip strength to the right upper extremity when compared to the left Sensation intact with differentiation sharp and dull touch Negative saddle paresthesias bilaterally Negative facial droop Negative aphasia Negative arm drift Fine motor skills intact  Skin: Skin is warm and dry. No rash noted. She is not diaphoretic. No erythema.  Psychiatric: She has a normal mood and affect. Her behavior is normal.  Thought content normal.  Nursing note and vitals reviewed.   ED Course  Procedures  6:35 PM this provider spoke with Dr. Leonel Ramsay, neurology. Discussed that patient is here. Reported that neurology  will be down to see the patient.   7:47 PM Patient seen and assessed by Dr. Janann Colonel, Neurologist who recommended Depakote, Toradol, and Reglan IV - recommended patient to be treated as a migraine. If continues to have pain - patient should have spinal tap performed to see if infectious versus pseudotumor cerebri.   8:54 PM Patient appears to be tachycardic with a heart rate of 137 bpm. EKG ordered, patient to be worked-up. Rectal temperature 97.40F.  10:10 PM This provider re-assessed the patient. Patient reported that the headache has mildly improved. Patient reported that the pain is a 5/10. Stated that she does not want a LP if it is needed, stated that she has issues with her back already and stated that she prefers not to have one.  Neuro exam: Patient continues to have weakness to the RLE and RUE. Sensation intact with differentiation to sharp and dull touch. Negative saddle paresthesias bilaterally.   10:21 PM This provider spoke with Dr. Janann Colonel, neurology - reported that if patient continues not to feel well patient to be admitted to Internal Medicine and MRI to be performed. Does not believe to be meningitis.   10:34 PM Patient reported that she would like to be admitted secondary to headache not improving. Spoke with Dr. Janann Colonel regarding it.    11:44 PM This provider spoke with Dr. Blaine Hamper, [facility] -discussed case, labs, imaging, ED course while in the ED setting and neurology recommendation of MRI. Patient to be admitted to Telemetry.   MDM  Medications  0.9 %  sodium chloride infusion ( Intravenous Stopped 03/19/14 1435)  valproate (DEPACON) 1,000 mg in dextrose 5 % 50 mL IVPB (not administered)  dexamethasone (DECADRON) injection 10 mg (10 mg Intravenous Given 03/19/14 1333)  diphenhydrAMINE (BENADRYL) injection 25 mg (25 mg Intravenous Given 03/19/14 1329)  promethazine (PHENERGAN) injection 12.5 mg (12.5 mg Intravenous Given 03/19/14 1331)  HYDROmorphone (DILAUDID) injection  0.5 mg (0.5 mg Intravenous Given 03/19/14 1432)  sodium chloride 0.9 % bolus 1,000 mL (0 mLs Intravenous Stopped 03/19/14 2033)  HYDROmorphone (DILAUDID) injection 0.5 mg (0.5 mg Intravenous Given 03/19/14 1949)  ketorolac (TORADOL) 30 MG/ML injection 30 mg (30 mg Intravenous Given 03/19/14 2028)  metoCLOPramide (REGLAN) injection 10 mg (10 mg Intravenous Given 03/19/14 2028)   Filed Vitals:   03/19/14 1815 03/19/14 1845 03/19/14 1945 03/19/14 2031  BP: 123/69 103/69 107/64 114/66  Pulse: 120 109 105 137  Temp: 98 F (36.7 C)     TempSrc: Oral     Resp: 20 19 16 16   Height:      Weight:      SpO2: 97% 95% 96% 98%    EKG noted sinus tachycardia with a heart rate of 129 bpm. troponin negative elevation. CBC negative elevated Hogans blood cell count. BMP unremarkable. CBG 118. Urine pregnancy negative. Urinalysis negative for infection-negative nitrites and leukocytes identified. CT head unremarkable. Denied thunderclap onset - doubt SAH. Patient seen and assessed by Neurology, Dr. Janann Colonel. Recommended patient to be treated for migraine - does not believe to be meningitis. Discussed that is headache does not get better patient may need LP to be performed - patient reported that she does not want LP. Discussed risk/benefits and importance to rule out infection - patient reported that she understood, but declined secondary to  already having plenty of back issues, patient does not want this - patient medically capable of making own decisions. Patient seen and assessed by attending physician who does not believe patient to be having meningitis. Patient has a history of migraines and is followed by Dr. Fernanda Drum, neurology. Patient given pain medications while in the ED setting - decreased headache discomfort. Patient reported that she is feeling better. Neurology reported that patient can be discharged or admitted based on how comfortable patient feels - patient opted to be admitted with MRI to be  performed. Dr. Janann Colonel made aware of patient's decision. While in ED setting, patient was tachycardic with heart rate being as high as 137 bpm - patient given IV fluids 3 L - heart rate decreased to 109 bpm. Patient denied chest pain, shortness of breath, difficulty breathing. Stated that she has history of high heart rate - reported that she is seen by Cardiology, [facility]. Patient to be admitted to the hospital for headache control. Patient to get MRI performed. Patient to be admitted to Telemetry. Patient stable for transfer.   Jamse Mead, PA-C 03/19/14 2346  Jamse Mead, PA-C 03/20/14 0130  Jamse Mead, PA-C 03/20/14 0136  Jamse Mead, PA-C 03/20/14 0321  Pamella Pert, MD 03/20/14 [phone]

## 2014-03-19 NOTE — ED Notes (Signed)
CBG = 118.  RN informed.

## 2014-03-19 NOTE — Consult Note (Addendum)
Consult Reason for Consult:headache and dizziness Referring Physician: Dr Aline Brochure  CC: headache  HPI: Danielle Warren is an 38 y.o. female past medical history of diabetes, hyperlipidemia, hypertension, degenerative disc disease, fibromyalgia presenting to emergency department with headache that started this morning. Patient reported that when she woke up she started to experience a headache localized to the temporal region of her head described as a throbbing sensation that progressively gotten worse brought the day. Reported associated nausea. + Photo and phonophobia. Notes some blurry vision associated with the headache. Good baseline vision without the headache. No transient obscurations. Notes a vertigo type sensation associated with the headache. Feels best in a quiet dark room.  Stated that she does have history of migraines, was diagnosed when she was a child, reports that this is the worst headache that she has ever experienced.   Feels unsteady on her feet. Has baseline chronic weakness of her RLE, told by PCP it is related to a hip abnormality. Reported that she normally has issues with gait secondary to her diabetic neuropathy, reported that today her gait has gotten progressively worse-feels unsteady when standing and when walking. Patient was seen and assessed in ED setting at bedside to [facility] where patient was transferred to [facility] for [facility]y to come and assess patient.. Denied fever, neck stiffness. .Denied history of TBI concussion.   Past Medical History  Diagnosis Date  . Diabetes mellitus   . Hypertension   . Hyperlipemia   . Reflux   . Fibromyalgia   . Degenerative disc disease     Past Surgical History  Procedure Laterality Date  . Peripherally inserted central catheter insertion    . Cervical cerclage    . Cholecystectomy    . Cesarean section    . Tubal ligation    . Abdominal hysterectomy      No family history on file.  Social History:  reports  that she has been smoking Cigarettes.  She has been smoking about 0.50 packs per day. She has never used smokeless tobacco. She reports that she drinks alcohol. She reports that she does not use illicit drugs.  Allergies  Allergen Reactions  . Macrolides And Ketolides Hives  . Penicillins Hives  . Sulfa Antibiotics   . Amoxicillin Rash    Medications: Prior to Admission:  (Not in a hospital admission)  Head CT imaging reviewed and was unremarkable.   ROS: Out of a complete 14 system review, the patient complains of only the following symptoms, and all other reviewed systems are negative. +headache, blurry vision, fatigue, weakness  Physical Examination: Blood pressure 103/69, pulse 109, temperature 98 F (36.7 C), temperature source Oral, resp. rate 19, height 5\' 1"  (1.549 m), weight 74.39 kg (164 lb), last menstrual period 02/04/2011, SpO2 95 %.  Neurologic Examination Mental Status: Alert, oriented, thought content appropriate.  Speech fluent without evidence of aphasia.  Able to follow 3 step commands without difficulty. Cranial Nerves: II: unable to visualize fundi due to pupil size and irritation from light, visual fields grossly normal, pupils equal, round, reactive to light and accommodation III,IV, VI: ptosis not present, extra-ocular motions intact bilaterally V,VII: smile symmetric, facial light touch sensation normal bilaterally VIII: hearing normal bilaterally IX,X: gag reflex present XI: trapezius strength/neck flexion strength normal bilaterally XII: tongue strength normal  Motor: pain with neck movement but no rigidity noted. Negative Kernigs and Brudzinski Right : Upper extremity    Left:     Upper extremity 5-/5 deltoid  5/5 deltoid 5-/5 biceps      5/5 biceps  5/5 triceps      5/5 triceps 5/5 hand grip      5/5 hand grip  Lower extremity     Lower extremity 4+/5 hip flexor      5/5 hip flexor 4+/5 quadricep     5/5 quadriceps  5-/5 hamstrings     5/5  hamstrings 4+/5 plantar flexion       5/5 plantar flexion 5/5 plantar extension     5/5 plantar extension Tone and bulk:normal tone throughout; no atrophy noted Sensory: Pinprick and light touch intact throughout, bilaterally Deep Tendon Reflexes: 2+ and symmetric throughout Plantars: Right: downgoing   Left: downgoing Cerebellar: normal finger-to-nose,  Gait: deferred due to fall risk  Laboratory Studies:   Basic Metabolic Panel:  Recent Labs Lab 03/19/14 1320  NA 135*  K 4.5  CL 100  CO2 25  GLUCOSE 121*  BUN 7  CREATININE 0.60  CALCIUM 9.4    Liver Function Tests: No results for input(s): AST, ALT, ALKPHOS, BILITOT, PROT, ALBUMIN in the last 168 hours. No results for input(s): LIPASE, AMYLASE in the last 168 hours. No results for input(s): AMMONIA in the last 168 hours.  CBC:  Recent Labs Lab 03/19/14 1320  WBC 9.7  NEUTROABS 3.0  HGB 13.9  HCT 41.5  MCV 71.4*  PLT 366    Cardiac Enzymes: No results for input(s): CKTOTAL, CKMB, CKMBINDEX, TROPONINI in the last 168 hours.  BNP: Invalid input(s): POCBNP  CBG:  Recent Labs Lab 03/19/14 1828  GLUCAP 118*    Microbiology: No results found for this or any previous visit.  Coagulation Studies: No results for input(s): LABPROT, INR in the last 72 hours.  Urinalysis: No results for input(s): COLORURINE, LABSPEC, PHURINE, GLUCOSEU, HGBUR, BILIRUBINUR, KETONESUR, PROTEINUR, UROBILINOGEN, NITRITE, LEUKOCYTESUR in the last 168 hours.  Invalid input(s): APPERANCEUR  Lipid Panel:  No results found for: CHOL, TRIG, HDL, CHOLHDL, VLDL, LDLCALC  HgbA1C: No results found for: HGBA1C  Urine Drug Screen:  No results found for: LABOPIA, COCAINSCRNUR, LABBENZ, AMPHETMU, THCU, LABBARB  Alcohol Level: No results for input(s): ETH in the last 168 hours.  Other results:  Imaging: Ct Head Wo Contrast  03/19/2014   CLINICAL DATA:  Headache, nausea and dizziness since this morning.  EXAM: CT HEAD WITHOUT  CONTRAST  TECHNIQUE: Contiguous axial images were obtained from the base of the skull through the vertex without intravenous contrast.  COMPARISON:  None.  FINDINGS: The ventricles are normal in size and configuration. No extra-axial fluid collections are identified. The gray-Ostroff differentiation is normal. No CT findings for acute intracranial process such as hemorrhage or infarction. No mass lesions. The brainstem and cerebellum are grossly normal.  The bony structures are intact. The paranasal sinuses and mastoid air cells are clear. The globes are intact.  IMPRESSION: Normal head CT.   Electronically Signed   By: Kalman Jewels M.D.   On: 03/19/2014 13:24     Assessment/Plan:  38y/o woman with history of headaches presenting for evaluation of severe headache. Clinical description of headache is most consistent with a diagnosis of migraine headache. With body habitus would also consider idiopathic intracranial hypertension though less likely based on history. Infectious etiology also a consideration though again less likely based on lack of fever and Resetar count.  -give headache cocktail of IV decadron 1000mg , toradol 30mg  and Reglan 10mg  -if no improvement can try IV magnesium sulfate 500mg  -continue IV hydration -if headache  does not resolve with above would suggest LP to check opening pressure, cell count, Gram stain, protein  -will continue to follow   Addendum: Patients headache went from 10/10 to 5/10 with headache cocktail and IV hydration. She continues to have right sided weakness and difficulty walking. LP was discussed with patient and she refuses, counseled on risk/benefits and she expresses understanding. Will admit to medicine service for further evaluation and treatment.   -check MRI/A and MRV of head -continue IV hydration -toradol 30mg  IV q8hrs x 3 doses, benadryl 25mg  IV q8hrs x 3 doses, reglan 10mg  IV q8hrs x 3 doses   Jim Like,  DO [facility] [phone]  If 7pm- 7am, please page neurology on call as listed in [place]. 03/19/2014, 7:08 PM

## 2014-03-19 NOTE — ED Notes (Signed)
Pt monitored by pulse ox, bp cuff, and 5-lead. 

## 2014-03-19 NOTE — H&P (Signed)
[facility] History and Physical  Danielle Warren ERD:[id number] DOB: July 01, 1975 DOA: 03/19/2014  Referring physician: ED physician PCP: Steva Colder, PA-C  Specialists:   Chief Complaint: severe headache  HPI: Danielle Warren is a 38 y.o. female with the past medical history of diabetes, hypertension, hyperlipidemia, GERD, history of migraine headaches, sleep apnea on CPAP, who presents with a severe headache.  Patient's headache started in early morning. Her headache is located over temporal areas. It is constant, severe 10 out of 10 in severity, throbbing. It is associated with the whole body shaking, nausea and photophobia. She also had blurry vision, and weakness in the right arm and leg, also slight weakness on the left leg. She reports that she has poor balance and feels unsteady when she walks.she had one episode of mild chest pain, which has resolved completely. Currently no chest pain. Patient does not have fever, chills, shortness of breath, abdominal pain, diarrhea, rashes. Denied neck stiffness. Patient was evaluated in ED setting at bedside to [facility] where patient was transferred to [facility] for neurology consultation  Work up in the ED demonstrates negative CT head, negative troponin, negative urinalysis. No leukocytosis. She is admitted to inpatient for further evaluation and treatment. Neurology was consulted.  Review of Systems: As presented in the history of presenting illness, rest negative.  Where does patient live?  At home Can patient participate in ADLs? Yes  Allergy:  Allergies  Allergen Reactions  . Macrolides And Ketolides Hives  . Penicillins Hives  . Sulfa Antibiotics   . Amoxicillin Rash    Past Medical History  Diagnosis Date  . Hypertension   . Hyperlipemia   . Fibromyalgia   . GERD (gastroesophageal reflux disease)   . Kidney stones   . Type II diabetes mellitus   . Asthma   . Chronic bronchitis     "get it about q yr"  (03/20/2014)  . Positive TB test   . History of blood transfusion     "after one of my miscarriages"  . Migraine     "had them when I was 7 or 38 yrs old; one day after sleep study on CPAP; one today" (03/20/2014)  . Degenerative disc disease   . Arthritis     "lower back; right hip; maybe my neck" (03/20/2014)  . Chronic back pain     "inbeween shoulders going up into my neck; lower back" (03/20/2014)  . Depression     Past Surgical History  Procedure Laterality Date  . Central venous catheter insertion Left 1999    chest  . Cervical cerclage  1997; 1999  . Cesarean section  1999  . Laparoscopic cholecystectomy  1997  . Lithotripsy  1990's X 1  . Abdominal hysterectomy  10/2012  . Dilation and curettage of uterus  1995; 1996    "miscarriages"  . Tubal ligation  1999  . Central venous catheter removal  06/1997  . Wisdom tooth extraction  1990's?    "all 4 at one time"    Social History:  reports that she has been smoking Cigarettes.  She has a 8.5 pack-year smoking history. She has never used smokeless tobacco. She reports that she drinks alcohol. She reports that she does not use illicit drugs.  Family History:  Family History  Problem Relation Age of Onset  . Hypertension Mother   . Diabetes Father   . Diabetes Sister      Prior to Admission medications   Medication Sig Start Date End  Date Taking? Authorizing Provider  estradiol (ESTRACE) 1 MG tablet Take 1 mg by mouth daily.   Yes Historical Provider, MD  gabapentin (NEURONTIN) 300 MG capsule Take 300 mg by mouth 3 (three) times daily.   Yes Historical Provider, MD  insulin NPH-regular (NOVOLIN 70/30) (70-30) 100 UNIT/ML injection Inject 10 Units into the skin 2 (two) times daily with a meal.   Yes Historical Provider, MD  lisinopril (PRINIVIL,ZESTRIL) 20 MG tablet Take 20 mg by mouth daily.   Yes Historical Provider, MD  methocarbamol (ROBAXIN) 500 MG tablet Take 500 mg by mouth every 8 (eight) hours as needed for  muscle spasms.   Yes Historical Provider, MD  omeprazole (PRILOSEC) 20 MG capsule Take 20 mg by mouth daily.   Yes Historical Provider, MD  primidone (MYSOLINE) 50 MG tablet Take by mouth 2 (two) times daily.   Yes Historical Provider, MD  solifenacin (VESICARE) 5 MG tablet Take 5 mg by mouth daily.   Yes Historical Provider, MD  traMADol (ULTRAM) 50 MG tablet Take 50 mg by mouth every 6 (six) hours as needed for moderate pain.    Yes Historical Provider, MD  HYDROcodone-acetaminophen (NORCO) 5-325 MG per tablet Take 2 tablets by mouth every 4 (four) hours as needed. 08/29/13   Veryl Speak, MD  predniSONE (DELTASONE) 10 MG tablet Take 2 tablets (20 mg total) by mouth 2 (two) times daily. 08/29/13   Veryl Speak, MD    Physical Exam: Filed Vitals:   03/20/14 0100 03/20/14 0115 03/20/14 0147 03/20/14 0248  BP: 116/76 109/72 119/71   Pulse: 107 100 100 82  Temp:   98.2 F (36.8 C)   TempSrc:   Oral   Resp: 16 18 20 16   Height:   5\' 1"  (1.549 m)   Weight:   77.747 kg (171 lb 6.4 oz)   SpO2: 98% 97% 100% 98%   General: Not in acute distress HEENT:       Eyes: PERRL, EOMI, no scleral icterus       ENT: No discharge from the ears and nose, no pharynx injection, no tonsillar enlargement.        Neck: No JVD, no bruit, no mass felt. Cardiac: S1/S2, RRR, No murmurs, No gallops or rubs Pulm: Good air movement bilaterally. Clear to auscultation bilaterally. No rales, wheezing, rhonchi or rubs. Abd: Soft, nondistended, nontender, no rebound pain, no organomegaly, BS present Ext: No edema bilaterally. 2+DP/PT pulse bilaterally Musculoskeletal: No joint deformities, erythema, or stiffness, ROM full Skin: No rashes.  Neuro: very drowsy, but oriented X3, cranial nerves II-XII grossly intact, muscle strength 4/5 in the right arm and leg, sensation to light touch intact. Brachial reflex 2+ bilaterally. Knee reflex 1+ bilaterally. Negative Babinski's sign. Normal finger to nose test. Psych: Patient is  not psychotic, no suicidal or hemocidal ideation.  Labs on Admission:  Basic Metabolic Panel:  Recent Labs Lab 03/19/14 1320  NA 135*  K 4.5  CL 100  CO2 25  GLUCOSE 121*  BUN 7  CREATININE 0.60  CALCIUM 9.4   Liver Function Tests: No results for input(s): AST, ALT, ALKPHOS, BILITOT, PROT, ALBUMIN in the last 168 hours. No results for input(s): LIPASE, AMYLASE in the last 168 hours. No results for input(s): AMMONIA in the last 168 hours. CBC:  Recent Labs Lab 03/19/14 1320  WBC 9.7  NEUTROABS 3.0  HGB 13.9  HCT 41.5  MCV 71.4*  PLT 366   Cardiac Enzymes:  Recent Labs Lab 03/19/14 2052  TROPONINI <  0.30    BNP (last 3 results) No results for input(s): PROBNP in the last 8760 hours. CBG:  Recent Labs Lab 03/19/14 1828 03/20/14 0229  GLUCAP 118* 210*    Radiological Exams on Admission: Ct Head Wo Contrast  03/19/2014   CLINICAL DATA:  Headache, nausea and dizziness since this morning.  EXAM: CT HEAD WITHOUT CONTRAST  TECHNIQUE: Contiguous axial images were obtained from the base of the skull through the vertex without intravenous contrast.  COMPARISON:  None.  FINDINGS: The ventricles are normal in size and configuration. No extra-axial fluid collections are identified. The gray-Craton differentiation is normal. No CT findings for acute intracranial process such as hemorrhage or infarction. No mass lesions. The brainstem and cerebellum are grossly normal.  The bony structures are intact. The paranasal sinuses and mastoid air cells are clear. The globes are intact.  IMPRESSION: Normal head CT.   Electronically Signed   By: Kalman Jewels M.D.   On: 03/19/2014 13:24    EKG: Independently reviewed.   Assessment/Plan Principal Problem:   Headache Active Problems:   Hyperlipemia   Fibromyalgia   HTN (hypertension)   DM (diabetes mellitus), type 2   Diabetes mellitus without complication  Severe headache: etiology is not clear. Neurology was consulted, LP  was suggested by dr. Janann Colonel, but the patient refused it.   -will admit to tele bed given tachycardia - appreciate Dr. Hazle Quant consultation - will follow up Neuro's recs as follows: - check MRI/A and MRV of head - continue IV hydration -  toradol 30mg  IV q8hrs x 3 doses, benadryl 25mg  IV q8hrs x 3 doses, reglan 10mg  IV q8hrs x 3 doses - pending imaging and response to treatment may consider DHE therapy - continue home meds: primidone - hold estradiol since it increase the risk of stroke and venous thromboses  DM-II: no A1c on record. Patient is on 70/30 insulin 10 units twice a day at home. - will decreased her 70/30 insuline to 6 U due to poor oral intake 2/2 severe headache  -check A1c - continue Neurontin  HTN:  -hold lisinopril because of her blood pressures are soft on admission  HLD: not on medications at home. - check FLP  Fibromyalgia: Continue home pain medications   DVT ppx: SQ Heparin    Code Status: Full code Family Communication: None at bed side.  Disposition Plan: Admit to inpatient   Date of Service 03/20/2014    Ivor Costa [facility] Pager [phone]  If 7PM-7AM, please contact night-coverage [email or web address] Password [email or web address] 03/20/2014, 3:21 AM

## 2014-03-19 NOTE — ED Notes (Addendum)
Pt to [facility] ED as a transfer from [place] for Neurology consult. Pt c/o posterior headache radiating to frontal lobes, states "it's like I'm getting hit in the head with a hammer between my eyes." Pt also reports unsteady gait, worsening today. Unsteady gait has been an ongoing problem due to her hip "something about my bones fusing together and arthritis of something." Pt had a history of migraines as a child, denies recurrent episodes as an adult. Headache associated with nausea and dizziness. Pt is tachycardic in 120s on arrival

## 2014-03-19 NOTE — ED Notes (Signed)
Headache, dizzy, nausea since this morning

## 2014-03-19 NOTE — ED Provider Notes (Signed)
CSN: [id number]     Arrival date & time 03/19/14  1128 History   First MD Initiated Contact with Patient 03/19/14 1230     Chief Complaint  Patient presents with  . Headache     (Consider location/radiation/quality/duration/timing/severity/associated sxs/prior Treatment) Patient is a 38 y.o. female presenting with headaches. The history is provided by the patient.  Headache Associated symptoms: dizziness, nausea and photophobia   Associated symptoms: no abdominal pain, no congestion, no cough, no sinus pressure and no vomiting    Danielle Warren is a 38 y.o. female who presents to the ED with a headache that started this morning followed by nausea and dizziness. She has a history of migraines with her first headache at age 70 years old. However this headache is different and is the worst of her life. She states she feels like someone is taking a hammer and hitting her in the head. The pain is located in the forehead and goes to the temporal area. Patient reports having had problems with her balance due to neuropathic pain of her feet. She states that her PCP is working on an appointment with a neurologist for her.    Past Medical History  Diagnosis Date  . Diabetes mellitus   . Hypertension   . Hyperlipemia   . Reflux   . Fibromyalgia   . Degenerative disc disease    Past Surgical History  Procedure Laterality Date  . Peripherally inserted central catheter insertion    . Cervical cerclage    . Cholecystectomy    . Cesarean section    . Tubal ligation    . Abdominal hysterectomy     No family history on file. History  Substance Use Topics  . Smoking status: Current Every Day Smoker -- 0.50 packs/day    Types: Cigarettes  . Smokeless tobacco: Never Used  . Alcohol Use: Yes     Comment: rare   OB History    No data available     Review of Systems  HENT: Negative for congestion and sinus pressure.   Eyes: Positive for photophobia.  Respiratory: Negative for cough.    Cardiovascular: Negative for chest pain.  Gastrointestinal: Positive for nausea. Negative for vomiting and abdominal pain.  Genitourinary: Negative for dysuria and frequency.  Musculoskeletal: Positive for gait problem (due to neuropathic pain).  Skin: Negative for rash.  Neurological: Positive for dizziness, weakness and headaches. Negative for syncope and speech difficulty.  Psychiatric/Behavioral: Negative for confusion. The patient is not nervous/anxious.       Allergies  Macrolides and ketolides; Penicillins; Sulfa antibiotics; and Amoxicillin  Home Medications   Prior to Admission medications   Medication Sig Start Date End Date Taking? Authorizing Provider  Adalimumab (HUMIRA [place]) Inject into the skin.   Yes Historical Provider, MD  DULoxetine HCl (CYMBALTA PO) Take by mouth.   Yes Historical Provider, MD  estradiol (ESTRACE) 1 MG tablet Take 1 mg by mouth daily.   Yes Historical Provider, MD  gabapentin (NEURONTIN) 300 MG capsule Take 300 mg by mouth 3 (three) times daily.   Yes Historical Provider, MD  insulin NPH-regular (NOVOLIN 70/30) (70-30) 100 UNIT/ML injection Inject 10 Units into the skin 2 (two) times daily with a meal.   Yes Historical Provider, MD  lisinopril (PRINIVIL,ZESTRIL) 20 MG tablet Take 20 mg by mouth daily.   Yes Historical Provider, MD  methocarbamol (ROBAXIN) 500 MG tablet Take 500 mg by mouth every 8 (eight) hours as needed for muscle spasms.   Yes  Historical Provider, MD  omeprazole (PRILOSEC) 20 MG capsule Take 20 mg by mouth daily.   Yes Historical Provider, MD  primidone (MYSOLINE) 50 MG tablet Take by mouth 2 (two) times daily.   Yes Historical Provider, MD  solifenacin (VESICARE) 5 MG tablet Take 5 mg by mouth daily.   Yes Historical Provider, MD  traMADol (ULTRAM) 50 MG tablet Take by mouth every 6 (six) hours as needed.   Yes Historical Provider, MD  HYDROcodone-acetaminophen (NORCO) 5-325 MG per tablet Take 2 tablets by mouth every 4 (four)  hours as needed. 08/29/13   Veryl Speak, MD  predniSONE (DELTASONE) 10 MG tablet Take 2 tablets (20 mg total) by mouth 2 (two) times daily. 08/29/13   Veryl Speak, MD   BP 143/90 mmHg  Pulse 107  Temp(Src) 98.4 F (36.9 C) (Oral)  Resp 20  Ht 5\' 1"  (1.549 m)  Wt 164 lb (74.39 kg)  BMI 31.00 kg/m2  SpO2 97%  LMP 02/04/2011 Physical Exam  Constitutional: She is oriented to person, place, and time. She appears well-developed and well-nourished. No distress.  HENT:  Head: Normocephalic and atraumatic.  Right Ear: Tympanic membrane normal.  Left Ear: Tympanic membrane normal.  Nose: Nose normal.  Mouth/Throat: Uvula is midline, oropharynx is clear and moist and mucous membranes are normal.  Eyes: Conjunctivae and EOM are normal. Pupils are equal, round, and reactive to light. Right eye exhibits no nystagmus. Left eye exhibits no nystagmus.  Neck: Normal range of motion. Neck supple.  Cardiovascular: Normal rate and regular rhythm.   Pulmonary/Chest: Effort normal. She has no wheezes. She has no rales.  Abdominal: Soft. Bowel sounds are normal. She exhibits no mass. There is no tenderness.  Musculoskeletal: She exhibits no edema.  Patient with difficulty walking. Unsteady gait, needed assistance with ambulation. Unable to stand on one foot.   Neurological: She is alert and oriented to person, place, and time. No cranial nerve deficit or sensory deficit. Gait abnormal. Coordination normal.  Reflex Scores:      Bicep reflexes are 1+ on the right side and 1+ on the left side.      Brachioradialis reflexes are 1+ on the right side and 1+ on the left side.      Patellar reflexes are 1+ on the right side and 1+ on the left side. Patient unable to stands on one foot due to problems she has had with balance due to neuropathic pain. Decreased strength lower extremities. Unable to do Romberg due to being unsteady.   Skin: Skin is warm and dry.  Psychiatric: She has a normal mood and affect. Her  behavior is normal.    ED Course  Procedures  Decadron 10 mg IV, Phenergan 12.5 mg IV, Benadryl 25 mg IV,  Dilaudid 0.5 mg IV Discussed with Dr. Kathrynn Humble and he examined the patient. Will transfer to [facility] ED for evaluation by Dr. Leonel Ramsay (neuro).  MDM  38 y.o. female with the worst headache of her life and dizziness that started earlier today with nausea and unsteady gait. Symptoms did improve with IV hydration and medications given in the ED but still complains of frontal headache and spinning. Arranged for transfer to [facility] for Neuro evaluation.     [place], NP 03/19/14 [place], MD 03/20/14 2206

## 2014-03-20 ENCOUNTER — Encounter (HOSPITAL_COMMUNITY): Payer: Self-pay | Admitting: General Practice

## 2014-03-20 ENCOUNTER — Observation Stay (HOSPITAL_COMMUNITY): Payer: Medicaid Other

## 2014-03-20 DIAGNOSIS — F17210 Nicotine dependence, cigarettes, uncomplicated: Secondary | ICD-10-CM | POA: Diagnosis present

## 2014-03-20 DIAGNOSIS — J42 Unspecified chronic bronchitis: Secondary | ICD-10-CM | POA: Diagnosis present

## 2014-03-20 DIAGNOSIS — M549 Dorsalgia, unspecified: Secondary | ICD-10-CM | POA: Diagnosis present

## 2014-03-20 DIAGNOSIS — G43901 Migraine, unspecified, not intractable, with status migrainosus: Secondary | ICD-10-CM | POA: Diagnosis present

## 2014-03-20 DIAGNOSIS — I10 Essential (primary) hypertension: Secondary | ICD-10-CM | POA: Diagnosis present

## 2014-03-20 DIAGNOSIS — E119 Type 2 diabetes mellitus without complications: Secondary | ICD-10-CM | POA: Insufficient documentation

## 2014-03-20 DIAGNOSIS — F329 Major depressive disorder, single episode, unspecified: Secondary | ICD-10-CM | POA: Diagnosis present

## 2014-03-20 DIAGNOSIS — E669 Obesity, unspecified: Secondary | ICD-10-CM | POA: Diagnosis present

## 2014-03-20 DIAGNOSIS — M797 Fibromyalgia: Secondary | ICD-10-CM | POA: Diagnosis present

## 2014-03-20 DIAGNOSIS — Z87442 Personal history of urinary calculi: Secondary | ICD-10-CM | POA: Diagnosis not present

## 2014-03-20 DIAGNOSIS — E1140 Type 2 diabetes mellitus with diabetic neuropathy, unspecified: Secondary | ICD-10-CM | POA: Diagnosis present

## 2014-03-20 DIAGNOSIS — G932 Benign intracranial hypertension: Secondary | ICD-10-CM | POA: Diagnosis present

## 2014-03-20 DIAGNOSIS — D72829 Elevated white blood cell count, unspecified: Secondary | ICD-10-CM | POA: Diagnosis present

## 2014-03-20 DIAGNOSIS — M1990 Unspecified osteoarthritis, unspecified site: Secondary | ICD-10-CM | POA: Diagnosis present

## 2014-03-20 DIAGNOSIS — Z881 Allergy status to other antibiotic agents status: Secondary | ICD-10-CM | POA: Diagnosis not present

## 2014-03-20 DIAGNOSIS — Z794 Long term (current) use of insulin: Secondary | ICD-10-CM | POA: Diagnosis not present

## 2014-03-20 DIAGNOSIS — R51 Headache: Secondary | ICD-10-CM | POA: Diagnosis present

## 2014-03-20 DIAGNOSIS — G894 Chronic pain syndrome: Secondary | ICD-10-CM | POA: Diagnosis present

## 2014-03-20 DIAGNOSIS — J45909 Unspecified asthma, uncomplicated: Secondary | ICD-10-CM | POA: Diagnosis present

## 2014-03-20 DIAGNOSIS — G4730 Sleep apnea, unspecified: Secondary | ICD-10-CM | POA: Diagnosis present

## 2014-03-20 DIAGNOSIS — Z880 Allergy status to penicillin: Secondary | ICD-10-CM | POA: Diagnosis not present

## 2014-03-20 DIAGNOSIS — Z882 Allergy status to sulfonamides status: Secondary | ICD-10-CM | POA: Diagnosis not present

## 2014-03-20 DIAGNOSIS — K219 Gastro-esophageal reflux disease without esophagitis: Secondary | ICD-10-CM | POA: Diagnosis present

## 2014-03-20 DIAGNOSIS — Z7952 Long term (current) use of systemic steroids: Secondary | ICD-10-CM | POA: Diagnosis not present

## 2014-03-20 DIAGNOSIS — E785 Hyperlipidemia, unspecified: Secondary | ICD-10-CM | POA: Diagnosis present

## 2014-03-20 DIAGNOSIS — T380X5A Adverse effect of glucocorticoids and synthetic analogues, initial encounter: Secondary | ICD-10-CM | POA: Diagnosis present

## 2014-03-20 DIAGNOSIS — Z6831 Body mass index (BMI) 31.0-31.9, adult: Secondary | ICD-10-CM | POA: Diagnosis not present

## 2014-03-20 LAB — HEMOGLOBIN A1C
Hgb A1c MFr Bld: 8 % — ABNORMAL HIGH (ref <5.7)
Mean Plasma Glucose: 183 mg/dL — ABNORMAL HIGH (ref <117)

## 2014-03-20 LAB — COMPREHENSIVE METABOLIC PANEL
ALT: 25 U/L (ref 0–35)
AST: 15 U/L (ref 0–37)
Albumin: 2.9 g/dL — ABNORMAL LOW (ref 3.5–5.2)
Alkaline Phosphatase: 53 U/L (ref 39–117)
Anion gap: 14 (ref 5–15)
BUN: 9 mg/dL (ref 6–23)
CO2: 20 mEq/L (ref 19–32)
Calcium: 8.5 mg/dL (ref 8.4–10.5)
Chloride: 103 mEq/L (ref 96–112)
Creatinine, Ser: 0.61 mg/dL (ref 0.50–1.10)
GFR calc Af Amer: >90 mL/min (ref >90)
GFR calc non Af Amer: >90 mL/min (ref >90)
Glucose, Bld: 250 mg/dL — ABNORMAL HIGH (ref 70–99)
Potassium: 4.3 mEq/L (ref 3.7–5.3)
Sodium: 137 mEq/L (ref 137–147)
Total Bilirubin: 0.3 mg/dL (ref 0.3–1.2)
Total Protein: 6.3 g/dL (ref 6.0–8.3)

## 2014-03-20 LAB — CBC
HCT: 37.5 % (ref 36.0–46.0)
Hemoglobin: 12.1 g/dL (ref 12.0–15.0)
MCH: 23.8 pg — ABNORMAL LOW (ref 26.0–34.0)
MCHC: 32.3 g/dL (ref 30.0–36.0)
MCV: 73.8 fL — ABNORMAL LOW (ref 78.0–100.0)
Platelets: 321 10*3/uL (ref 150–400)
RBC: 5.08 MIL/uL (ref 3.87–5.11)
RDW: 14.1 % (ref 11.5–15.5)
WBC: 13.4 10*3/uL — ABNORMAL HIGH (ref 4.0–10.5)

## 2014-03-20 LAB — GLUCOSE, CAPILLARY
Glucose-Capillary: 210 mg/dL — ABNORMAL HIGH (ref 70–99)
Glucose-Capillary: 196 mg/dL — ABNORMAL HIGH (ref 70–99)
Glucose-Capillary: 221 mg/dL — ABNORMAL HIGH (ref 70–99)
Glucose-Capillary: 238 mg/dL — ABNORMAL HIGH (ref 70–99)
Glucose-Capillary: 207 mg/dL — ABNORMAL HIGH (ref 70–99)

## 2014-03-20 LAB — LIPID PANEL
Cholesterol: 170 mg/dL (ref 0–200)
HDL: 39 mg/dL — ABNORMAL LOW (ref >39)
LDL Cholesterol: 111 mg/dL — ABNORMAL HIGH (ref 0–99)
Total CHOL/HDL Ratio: 4.4 RATIO
Triglycerides: 98 mg/dL (ref <150)
VLDL: 20 mg/dL (ref 0–40)

## 2014-03-20 LAB — PROTIME-INR
INR: 1.12 (ref 0.00–1.49)
Prothrombin Time: 14.5 seconds (ref 11.6–15.2)

## 2014-03-20 MED ORDER — INSULIN ASPART PROT & ASPART (70-30 MIX) 100 UNIT/ML ~~LOC~~ SUSP
12.0000 [IU] | Freq: Two times a day (BID) | SUBCUTANEOUS | Status: DC
  Administered 2014-03-20: 12 [IU] via SUBCUTANEOUS

## 2014-03-20 MED ORDER — PRIMIDONE 50 MG PO TABS
50.0000 mg | ORAL_TABLET | Freq: Two times a day (BID) | ORAL | Status: DC
  Administered 2014-03-21: 50 mg via ORAL
  Filled 2014-03-20 (×2): qty 1

## 2014-03-20 MED ORDER — KETOROLAC TROMETHAMINE 30 MG/ML IJ SOLN
30.0000 mg | Freq: Three times a day (TID) | INTRAMUSCULAR | Status: AC
  Administered 2014-03-20 (×2): 30 mg via INTRAVENOUS
  Filled 2014-03-20 (×2): qty 1

## 2014-03-20 MED ORDER — DIHYDROERGOTAMINE MESYLATE 1 MG/ML IJ SOLN
0.5000 mg | Freq: Three times a day (TID) | INTRAMUSCULAR | Status: AC
  Administered 2014-03-20 – 2014-03-21 (×3): 0.5 mg via INTRAVENOUS
  Filled 2014-03-20 (×3): qty 0.5

## 2014-03-20 MED ORDER — CHLORHEXIDINE GLUCONATE 0.12 % MT SOLN
15.0000 mL | Freq: Two times a day (BID) | OROMUCOSAL | Status: DC
  Administered 2014-03-20 – 2014-03-21 (×2): 15 mL via OROMUCOSAL
  Filled 2014-03-20 (×2): qty 15

## 2014-03-20 MED ORDER — CETYLPYRIDINIUM CHLORIDE 0.05 % MT LIQD
7.0000 mL | Freq: Two times a day (BID) | OROMUCOSAL | Status: DC
  Administered 2014-03-21: 7 mL via OROMUCOSAL

## 2014-03-20 MED ORDER — DIPHENHYDRAMINE HCL 50 MG/ML IJ SOLN
25.0000 mg | Freq: Three times a day (TID) | INTRAMUSCULAR | Status: AC
  Administered 2014-03-20 (×2): 25 mg via INTRAVENOUS
  Filled 2014-03-20 (×2): qty 1

## 2014-03-20 MED ORDER — MAGNESIUM SULFATE 2 GM/50ML IV SOLN
2.0000 g | Freq: Once | INTRAVENOUS | Status: AC
  Administered 2014-03-20: 2 g via INTRAVENOUS
  Filled 2014-03-20: qty 50

## 2014-03-20 MED ORDER — SODIUM CHLORIDE 0.9 % IJ SOLN
3.0000 mL | Freq: Two times a day (BID) | INTRAMUSCULAR | Status: DC
  Administered 2014-03-20: 3 mL via INTRAVENOUS

## 2014-03-20 MED ORDER — SODIUM CHLORIDE 0.9 % IV SOLN
INTRAVENOUS | Status: DC
  Administered 2014-03-20: 125 mL/h via INTRAVENOUS

## 2014-03-20 MED ORDER — DARIFENACIN HYDROBROMIDE ER 7.5 MG PO TB24
7.5000 mg | ORAL_TABLET | Freq: Every day | ORAL | Status: DC
  Administered 2014-03-20 – 2014-03-21 (×2): 7.5 mg via ORAL
  Filled 2014-03-20 (×2): qty 1

## 2014-03-20 MED ORDER — HEPARIN SODIUM (PORCINE) 5000 UNIT/ML IJ SOLN
5000.0000 [IU] | Freq: Three times a day (TID) | INTRAMUSCULAR | Status: DC
  Administered 2014-03-20 – 2014-03-21 (×4): 5000 [IU] via SUBCUTANEOUS
  Filled 2014-03-20 (×4): qty 1

## 2014-03-20 MED ORDER — GABAPENTIN 300 MG PO CAPS
300.0000 mg | ORAL_CAPSULE | Freq: Three times a day (TID) | ORAL | Status: DC
  Administered 2014-03-20: 300 mg via ORAL
  Filled 2014-03-20: qty 1

## 2014-03-20 MED ORDER — METHOCARBAMOL 500 MG PO TABS
500.0000 mg | ORAL_TABLET | Freq: Three times a day (TID) | ORAL | Status: DC | PRN
  Administered 2014-03-20 – 2014-03-21 (×3): 500 mg via ORAL
  Filled 2014-03-20 (×3): qty 1

## 2014-03-20 MED ORDER — METOCLOPRAMIDE HCL 5 MG/ML IJ SOLN
10.0000 mg | Freq: Three times a day (TID) | INTRAMUSCULAR | Status: AC
  Administered 2014-03-20 (×2): 10 mg via INTRAVENOUS
  Filled 2014-03-20 (×2): qty 2

## 2014-03-20 MED ORDER — GABAPENTIN 300 MG PO CAPS
900.0000 mg | ORAL_CAPSULE | Freq: Three times a day (TID) | ORAL | Status: DC
  Administered 2014-03-20 – 2014-03-21 (×5): 900 mg via ORAL
  Filled 2014-03-20 (×2): qty 3
  Filled 2014-03-20: qty 9
  Filled 2014-03-20 (×2): qty 3

## 2014-03-20 MED ORDER — PANTOPRAZOLE SODIUM 40 MG PO TBEC
40.0000 mg | DELAYED_RELEASE_TABLET | Freq: Every day | ORAL | Status: DC
  Administered 2014-03-20 – 2014-03-21 (×2): 40 mg via ORAL
  Filled 2014-03-20 (×2): qty 1

## 2014-03-20 MED ORDER — INSULIN ASPART PROT & ASPART (70-30 MIX) 100 UNIT/ML ~~LOC~~ SUSP
6.0000 [IU] | Freq: Two times a day (BID) | SUBCUTANEOUS | Status: DC
  Administered 2014-03-20: 12 [IU] via SUBCUTANEOUS
  Filled 2014-03-20: qty 10

## 2014-03-20 MED ORDER — TRAMADOL HCL 50 MG PO TABS
50.0000 mg | ORAL_TABLET | Freq: Four times a day (QID) | ORAL | Status: DC | PRN
Start: 2014-03-20 — End: 2014-03-21
  Administered 2014-03-20 – 2014-03-21 (×4): 50 mg via ORAL
  Filled 2014-03-20 (×5): qty 1

## 2014-03-20 MED ORDER — HYDROCODONE-ACETAMINOPHEN 5-325 MG PO TABS
2.0000 | ORAL_TABLET | ORAL | Status: DC | PRN
  Administered 2014-03-20: 2 via ORAL
  Filled 2014-03-20: qty 2

## 2014-03-20 NOTE — Progress Notes (Signed)
Received consult about patient not being able to pay for medication - Patient has Medicaid as insurance provider/ prescriptions usually cost $3.00 Member: Danielle Warren [[id number]]  Plan: MEDICAID [place] ACCESS [phone]* Payor: MEDICAID [place] [[id number]]      Patient stated that her mother and brother has been paying for her medication since she lost her job; she is on section 8 and the local Church's have been giving her assistance also; patient also stated that she is trying to get Disability; Soc Worker to see patient also; CM informed patient that since she has Medicaid, the hospital cannot assist her with her medication and to continue to allow her mother / brother pay for her medication until she gets situated; Mindi Slicker RN,BSN,MHA [phone]

## 2014-03-20 NOTE — Progress Notes (Signed)
MD made aware of pt's low BP. Pt's BP has been normal throughout the day. She has not had fluids running most of the day. Awaiting further orders at this time.

## 2014-03-20 NOTE — ED Notes (Signed)
Manuela Schwartz, admitting RN at bedside

## 2014-03-20 NOTE — Progress Notes (Signed)
Pt stated to bedside RN that she was feeling some tightness in the left side of her chest, "like a cramp." Tightness started subsiding two minutes later. MD made aware. No new orders received at this time. Will continue to monitor.

## 2014-03-20 NOTE — ED Notes (Signed)
Hospitalist at bedside 

## 2014-03-20 NOTE — Progress Notes (Signed)
Patient arrived to floor from ED. Patient stated that her migraine was at a 4/10 on pain scale. Patient placed on telemetry. Patient placed on bedrest. Patient oriented to floor and room information was given. Given opportunity to ask questions. Will continue to monitor accordingly.

## 2014-03-20 NOTE — ED Notes (Signed)
Attempted to call report

## 2014-03-20 NOTE — Progress Notes (Signed)
PROGRESS NOTE  Aslin Farinas ACZ:[id number] DOB: 04-05-1976 DOA: 03/19/2014 PCP: Roosvelt Maser  HPI/Subjective: 38 yo female who came to the ED with complaints of severe headache.  Headache started early in the morning and is located over the temporal area.  Constant, 10/10 in severity, and throbbing. It is associated with shaking, nausea, and photophobia.  Pt reports blurry vision and weakness in the right arm and leg and weakness in the left leg.  She reports that she has poor balance and feels unsteady when she walks.  She had one episode of mild chest pain, which has resolved completely. Pt denies fever, chills, shortness of breath, abdominal pain, diarrhea, rashes. Denies neck stiffness. Work up in the ED demonstrates negative CT head, negative troponin, negative urinalysis. No leukocytosis. She is admitted to inpatient for further evaluation and treatment. Neurology was consulted.      Pt reports she is improved with ha now a 4/10 in severity.  Still has some sensitivity to light but this has improved.  Tolerating diet without nausea/vomiting.    Assessment/Plan: Suspected Status Migrainous:Improved.  Continue benadryl, toradol, and reglan q8h for 3 doses. MRI/MRA/MRV Brain negative. Will defer further work up to Neurology. Neurology contemplating performing LP, offered yesterday-but refused.          Leukocytosis: WBC 13.4.  Due to decadron given in ED on 11/18.  No signs of infection.  Hyperlipemia: Chronic problem.  LDL 111 and HDL 39.  Consider starting on statin therapy.    Fibromyalgia/Chronic Pain Syndrome Chronic problem.  Continue Enablex, Gabapentin, Primidone, PRN Robaxin, PRN Tramadol, and PRN Norco.     Hypertension: Chronic problem.  Last BP 123/70.  Managed on Lisinopril 20mg  daily at home.  Will resume at d/c.          Diabetes Mellitus, type 2:  Chronic problem.  Continue Novolin 70/30 12 units BID.     DVT Prophylaxis: Heparin 5,000 units q8h  Code Status:  Full Family Communication: Pt is awake and alert.  Disposition Plan: Will d/c home when medically appropriate.     Consultants:  Neurology   Procedures:  None  Antibiotics:  None  Objective: Filed Vitals:   03/20/14 0248 03/20/14 0545 03/20/14 0632 03/20/14 0937  BP:   130/77 123/70  Pulse: 82  94 95  Temp:   98.1 F (36.7 C) 98.6 F (37 C)  TempSrc:   Oral Oral  Resp: 16 17 18 18   Height:      Weight:      SpO2: 98%  100% 100%    Intake/Output Summary (Last 24 hours) at 03/20/14 1041 Last data filed at 03/20/14 0106  Gross per 24 hour  Intake   2000 ml  Output      0 ml  Net   2000 ml   Filed Weights   03/19/14 1132 03/20/14 0147  Weight: 74.39 kg (164 lb) 77.747 kg (171 lb 6.4 oz)    Exam: General: Overweight, NAD, appears stated age  47:  Anicteic Sclera, MMM.  Cardiovascular: RRR, S1 S2 auscultated, no rubs, murmurs or gallops.   Respiratory: Clear to auscultation bilaterally with equal chest rise  Abdomen: Soft, nontender, nondistended Extremities: warm dry without cyanosis clubbing or edema.  Neuro: AAOx3, cranial nerves grossly intact. Psych: Normal affect and demeanor with intact judgement and insight   Data Reviewed: Basic Metabolic Panel:  Recent Labs Lab 03/19/14 1320 03/20/14 0930  NA 135* 137  K 4.5 4.3  CL 100 103  CO2 25 20  GLUCOSE 121* 250*  BUN 7 9  CREATININE 0.60 0.61  CALCIUM 9.4 8.5   Liver Function Tests:  Recent Labs Lab 03/20/14 0930  AST 15  ALT 25  ALKPHOS 53  BILITOT 0.3  PROT 6.3  ALBUMIN 2.9*   CBC:  Recent Labs Lab 03/19/14 1320 03/20/14 0930  WBC 9.7 13.4*  NEUTROABS 3.0  --   HGB 13.9 12.1  HCT 41.5 37.5  MCV 71.4* 73.8*  PLT 366 321   Cardiac Enzymes:  Recent Labs Lab 03/19/14 2052  TROPONINI <0.30   CBG:  Recent Labs Lab 03/19/14 1828 03/20/14 0229 03/20/14 0856  GLUCAP 118* 210* 196*    Studies: Ct Head Wo Contrast  03/19/2014   CLINICAL DATA:  Headache, nausea  and dizziness since this morning.  EXAM: CT HEAD WITHOUT CONTRAST  TECHNIQUE: Contiguous axial images were obtained from the base of the skull through the vertex without intravenous contrast.  COMPARISON:  None.  FINDINGS: The ventricles are normal in size and configuration. No extra-axial fluid collections are identified. The gray-Broberg differentiation is normal. No CT findings for acute intracranial process such as hemorrhage or infarction. No mass lesions. The brainstem and cerebellum are grossly normal.  The bony structures are intact. The paranasal sinuses and mastoid air cells are clear. The globes are intact.  IMPRESSION: Normal head CT.   Electronically Signed   By: Kalman Jewels M.D.   On: 03/19/2014 13:24   Mr Jodene Nam Head Wo Contrast  03/20/2014   CLINICAL DATA:  38 year old female with severe headache associated with photophobia, nausea, right lower extremity weakness. Initial encounter.  EXAM: MRI HEAD WITHOUT CONTRAST  MRA HEAD WITHOUT CONTRAST  MRV HEAD WITHOUT CONTRAST  TECHNIQUE: Multiplanar, multiecho pulse sequences of the brain and surrounding structures were obtained without intravenous contrast. Angiographic images of the head were obtained using both MRA and MRV technique without contrast.  COMPARISON:  Head CTs without contrast 03/19/2014 and earlier.  FINDINGS: MRI HEAD FINDINGS  Cerebral volume is normal. No restricted diffusion to suggest acute infarction. No midline shift, mass effect, evidence of mass lesion, ventriculomegaly, extra-axial collection or acute intracranial hemorrhage. Cervicomedullary junction and pituitary are within normal limits. Negative visualized cervical spine. Major intracranial vascular flow voids are preserved. Pear[place] Cables and [place] matter signal is within normal limits throughout the brain.  Visible internal auditory structures appear normal. Visualized orbit soft tissues are within normal limits. Minor paranasal sinus mucosal thickening. Mastoids are clear.  Visualized scalp soft tissues are within normal limits. Normal bone marrow signal.  MRA HEAD FINDINGS  Antegrade flow in the posterior circulation with codominant distal vertebral arteries. Normal vertebrobasilar junction. Small right PICA. Normal AICA origins. Normal basilar artery. Normal SCA and PCA origins. Posterior communicating arteries are diminutive or absent. Bilateral PCA branches are within normal limits.  Antegrade flow in both ICA siphons. Tortuous distal cervical ICAs, more so the right. Tortuous cavernous segment on the left. No siphon stenosis. Normal ophthalmic artery origins. Normal carotid termini, MCA and ACA origins. Tortuous proximal ACA is. Duplicated or fenestrated appearing anterior communicating artery (series 507, image 3). Visualized ACA branches are within normal limits. Visualized bilateral MCA branches are within normal limits.  MRV HEAD FINDINGS  Preserved flow signal in the superior sagittal sinus, torcula, transverse sinuses, straight sinus, vein of Galen, internal cerebral veins, and basal veins of Rosenthal. Codominant transverse sinus anatomy. Preserved flow signal in both sigmoid sinuses and IJ bulbs.  IMPRESSION: 1.  Normal non contrast  MRI appearance of the brain. 2.  Negative intracranial MRA. 3. Negative intracranial MRV.   Electronically Signed   By: Lars Pinks M.D.   On: 03/20/2014 08:55   Mr Brain Wo Contrast  03/20/2014   CLINICAL DATA:  38 year old female with severe headache associated with photophobia, nausea, right lower extremity weakness. Initial encounter.  EXAM: MRI HEAD WITHOUT CONTRAST  MRA HEAD WITHOUT CONTRAST  MRV HEAD WITHOUT CONTRAST  TECHNIQUE: Multiplanar, multiecho pulse sequences of the brain and surrounding structures were obtained without intravenous contrast. Angiographic images of the head were obtained using both MRA and MRV technique without contrast.  COMPARISON:  Head CTs without contrast 03/19/2014 and earlier.  FINDINGS: MRI HEAD FINDINGS   Cerebral volume is normal. No restricted diffusion to suggest acute infarction. No midline shift, mass effect, evidence of mass lesion, ventriculomegaly, extra-axial collection or acute intracranial hemorrhage. Cervicomedullary junction and pituitary are within normal limits. Negative visualized cervical spine. Major intracranial vascular flow voids are preserved. Pear[place] Cables and [place] matter signal is within normal limits throughout the brain.  Visible internal auditory structures appear normal. Visualized orbit soft tissues are within normal limits. Minor paranasal sinus mucosal thickening. Mastoids are clear. Visualized scalp soft tissues are within normal limits. Normal bone marrow signal.  MRA HEAD FINDINGS  Antegrade flow in the posterior circulation with codominant distal vertebral arteries. Normal vertebrobasilar junction. Small right PICA. Normal AICA origins. Normal basilar artery. Normal SCA and PCA origins. Posterior communicating arteries are diminutive or absent. Bilateral PCA branches are within normal limits.  Antegrade flow in both ICA siphons. Tortuous distal cervical ICAs, more so the right. Tortuous cavernous segment on the left. No siphon stenosis. Normal ophthalmic artery origins. Normal carotid termini, MCA and ACA origins. Tortuous proximal ACA is. Duplicated or fenestrated appearing anterior communicating artery (series 507, image 3). Visualized ACA branches are within normal limits. Visualized bilateral MCA branches are within normal limits.  MRV HEAD FINDINGS  Preserved flow signal in the superior sagittal sinus, torcula, transverse sinuses, straight sinus, vein of Galen, internal cerebral veins, and basal veins of Rosenthal. Codominant transverse sinus anatomy. Preserved flow signal in both sigmoid sinuses and IJ bulbs.  IMPRESSION: 1.  Normal non contrast MRI appearance of the brain. 2.  Negative intracranial MRA. 3. Negative intracranial MRV.   Electronically Signed   By: Lars Pinks M.D.   On:  03/20/2014 08:55   Mr Mrv Head Wo Cm  03/20/2014   CLINICAL DATA:  38 year old female with severe headache associated with photophobia, nausea, right lower extremity weakness. Initial encounter.  EXAM: MRI HEAD WITHOUT CONTRAST  MRA HEAD WITHOUT CONTRAST  MRV HEAD WITHOUT CONTRAST  TECHNIQUE: Multiplanar, multiecho pulse sequences of the brain and surrounding structures were obtained without intravenous contrast. Angiographic images of the head were obtained using both MRA and MRV technique without contrast.  COMPARISON:  Head CTs without contrast 03/19/2014 and earlier.  FINDINGS: MRI HEAD FINDINGS  Cerebral volume is normal. No restricted diffusion to suggest acute infarction. No midline shift, mass effect, evidence of mass lesion, ventriculomegaly, extra-axial collection or acute intracranial hemorrhage. Cervicomedullary junction and pituitary are within normal limits. Negative visualized cervical spine. Major intracranial vascular flow voids are preserved. Pearline Cables and [place] matter signal is within normal limits throughout the brain.  Visible internal auditory structures appear normal. Visualized orbit soft tissues are within normal limits. Minor paranasal sinus mucosal thickening. Mastoids are clear. Visualized scalp soft tissues are within normal limits. Normal bone marrow signal.  MRA HEAD FINDINGS  Antegrade flow in the posterior circulation with codominant distal vertebral arteries. Normal vertebrobasilar junction. Small right PICA. Normal AICA origins. Normal basilar artery. Normal SCA and PCA origins. Posterior communicating arteries are diminutive or absent. Bilateral PCA branches are within normal limits.  Antegrade flow in both ICA siphons. Tortuous distal cervical ICAs, more so the right. Tortuous cavernous segment on the left. No siphon stenosis. Normal ophthalmic artery origins. Normal carotid termini, MCA and ACA origins. Tortuous proximal ACA is. Duplicated or fenestrated appearing anterior  communicating artery (series 507, image 3). Visualized ACA branches are within normal limits. Visualized bilateral MCA branches are within normal limits.  MRV HEAD FINDINGS  Preserved flow signal in the superior sagittal sinus, torcula, transverse sinuses, straight sinus, vein of Galen, internal cerebral veins, and basal veins of Rosenthal. Codominant transverse sinus anatomy. Preserved flow signal in both sigmoid sinuses and IJ bulbs.  IMPRESSION: 1.  Normal non contrast MRI appearance of the brain. 2.  Negative intracranial MRA. 3. Negative intracranial MRV.   Electronically Signed   By: Lars Pinks M.D.   On: 03/20/2014 08:55    Scheduled Meds: . darifenacin  7.5 mg Oral Daily  . diphenhydrAMINE  25 mg Intravenous 3 times per day  . gabapentin  900 mg Oral TID  . heparin  5,000 Units Subcutaneous 3 times per day  . insulin aspart protamine- aspart  12 Units Subcutaneous BID WC  . ketorolac  30 mg Intravenous 3 times per day  . metoCLOPramide (REGLAN) injection  10 mg Intravenous 3 times per day  . pantoprazole  40 mg Oral Daily  . [START ON 03/21/2014] primidone  50 mg Oral BID  . sodium chloride  3 mL Intravenous Q12H   Continuous Infusions: . sodium chloride 125 mL/hr (03/20/14 0253)    Principal Problem:   Headache Active Problems:   Hyperlipemia   Fibromyalgia   HTN (hypertension)   DM (diabetes mellitus), type 2   Diabetes mellitus without complication    Adelene Idler PA-S  [facility] Pager [phone]. If 7PM-7AM, please contact night-coverage at [email or web address], password [facility] 03/20/2014, 10:41 AM  LOS: 1 day    Attending Patient seen and examined, agree with the above assessment and plan. Above documentation was reviewed, necessary changes have been made. Admitted with intractable headaches, likely migraine headaches. Some improvement with cocktail of Toradol, Benadryl and Reglan. We will await further workup be done at the discretion of neurology. Other medical  problems include diabetes remain stable.  Nena Alexander MD

## 2014-03-20 NOTE — Progress Notes (Signed)
Subjective: headache better than last night, but still with some headache. She endorses photophobia, nausea. She has had visual aura. It was not thunderclap in onset.  Exam: Filed Vitals:   03/20/14 1413  BP: 135/73  Pulse: 98  Temp: 98.6 F (37 C)  Resp: 18   Gen: In bed, NAD MS: awake, alert, interactive and appropriate YS:AYTKZ, EOMI Motor: 5/5 throughout Sensory:intact to LT Neck: Supple  MRI/A/V -no signs of pathology  Impression: 38 year old female with a history of migraines and a headache that is most consistent with migraine. She has no signs concerning for infection, and imaging is negative. At this time I would favor treating this as migraine.  Recommendations: 1) DHE protocol 2) we'll continue to follow  Roland Rack, MD [facility] [phone]  If 7pm- 7am, please page neurology on call as listed in [place].

## 2014-03-21 LAB — CBC
HCT: 37.3 % (ref 36.0–46.0)
Hemoglobin: 12.1 g/dL (ref 12.0–15.0)
MCH: 23.9 pg — ABNORMAL LOW (ref 26.0–34.0)
MCHC: 32.4 g/dL (ref 30.0–36.0)
MCV: 73.7 fL — ABNORMAL LOW (ref 78.0–100.0)
Platelets: 299 10*3/uL (ref 150–400)
RBC: 5.06 MIL/uL (ref 3.87–5.11)
RDW: 14.3 % (ref 11.5–15.5)
WBC: 11.8 10*3/uL — ABNORMAL HIGH (ref 4.0–10.5)

## 2014-03-21 LAB — GLUCOSE, CAPILLARY: Glucose-Capillary: 72 mg/dL (ref 70–99)

## 2014-03-21 MED ORDER — DIPHENHYDRAMINE HCL 25 MG PO CAPS: 25.0000 mg | ORAL_CAPSULE | Freq: Four times a day (QID) | ORAL | Status: AC | PRN

## 2014-03-21 MED ORDER — PROCHLORPERAZINE MALEATE 10 MG PO TABS: 10.0000 mg | ORAL_TABLET | Freq: Four times a day (QID) | ORAL | Status: DC | PRN

## 2014-03-21 MED ORDER — GABAPENTIN 300 MG PO CAPS: 600.0000 mg | ORAL_CAPSULE | Freq: Three times a day (TID) | ORAL | Status: DC

## 2014-03-21 MED ORDER — DULOXETINE HCL 60 MG PO CPEP
60.0000 mg | ORAL_CAPSULE | Freq: Every day | ORAL | Status: DC
  Administered 2014-03-21: 60 mg via ORAL
  Filled 2014-03-21: qty 1

## 2014-03-21 NOTE — Progress Notes (Signed)
Pt discharging at this time with her husband. Alert, verbal with no complaints of pain or discomfort. No noted distress. IV discontinued dry dressing. Discharge instructions and prescriptions given to pt with verbal understanding aware of scheduling of follow up appts. Pt took all personal belongings with her home.

## 2014-03-21 NOTE — Progress Notes (Signed)
Inpatient Diabetes Program Recommendations  AACE/ADA: New Consensus Statement on Inpatient Glycemic Control (2013)  Target Ranges:  Prepandial:   less than 140 mg/dL      Peak postprandial:   less than 180 mg/dL (1-2 hours)      Critically ill patients:  140 - 180 mg/dL     Results for Danielle Warren, Danielle Warren (MRN [id number]) as of 03/21/2014 10:37  Ref. Range 03/20/2014 08:56 03/20/2014 11:34 03/20/2014 16:55 03/20/2014 21:09  Glucose-Capillary Latest Range: 70-99 mg/dL 196 (H) 221 (H) 238 (H) 207 (H)     Admitted with Headache.  History of DM, HTN.  Home DM Meds: 70/30 insulin- 10 units bidwc  Current DM Meds: 70/30 insulin- 12 units bidwc    MD- Please add Novolog Sensitive SSI tid ac + HS     Will follow Wyn Quaker RN, MSN, CDE Diabetes Coordinator Inpatient Diabetes Program Team Pager: [phone] (8a-10p)

## 2014-03-21 NOTE — Discharge Summary (Signed)
PATIENT DETAILS Name: Danielle Warren Age: 37 y.o. Sex: female Date of Birth: Jan 29, 1976 MRN: [id number]. Admitting Physician: Ivor Costa, MD VFI:[id number], Octaviano Glow, PA-C  Admit Date: 03/19/2014 Discharge date: 03/21/2014  Recommendations for Outpatient Follow-up:  1. Please refer to outpatient neurology for further management of presumed migraine headaches.  PRIMARY DISCHARGE DIAGNOSIS:  Principal Problem:   Headache-Suspect Migraine Active Problems:   Hyperlipemia   Fibromyalgia   HTN (hypertension)   DM (diabetes mellitus), type 2   Diabetes mellitus without complication      PAST MEDICAL HISTORY: Past Medical History  Diagnosis Date  . Hypertension   . Hyperlipemia   . Fibromyalgia   . GERD (gastroesophageal reflux disease)   . Kidney stones   . Type II diabetes mellitus   . Asthma   . Chronic bronchitis     "get it about q yr" (03/20/2014)  . Positive TB test   . History of blood transfusion     "after one of my miscarriages"  . Migraine     "had them when I was 7 or 38 yrs old; one day after sleep study on CPAP; one today" (03/20/2014)  . Degenerative disc disease   . Arthritis     "lower back; right hip; maybe my neck" (03/20/2014)  . Chronic back pain     "inbeween shoulders going up into my neck; lower back" (03/20/2014)  . Depression     DISCHARGE MEDICATIONS: Current Discharge Medication List    START taking these medications   Details  diphenhydrAMINE (BENADRYL) 25 mg capsule Take 1 capsule (25 mg total) by mouth every 6 (six) hours as needed (headache along with compazine). Qty: 30 capsule, Refills: 0    prochlorperazine (COMPAZINE) 10 MG tablet Take 1 tablet (10 mg total) by mouth every 6 (six) hours as needed (headache). Qty: 30 tablet, Refills: 0      CONTINUE these medications which have CHANGED   Details  gabapentin (NEURONTIN) 300 MG capsule Take 2 capsules (600 mg total) by mouth 3 (three) times daily. Qty: 90 capsule, Refills: 0        CONTINUE these medications which have NOT CHANGED   Details  estradiol (ESTRACE) 1 MG tablet Take 1 mg by mouth daily.    insulin NPH-regular (NOVOLIN 70/30) (70-30) 100 UNIT/ML injection Inject 10 Units into the skin 2 (two) times daily with a meal.    lisinopril (PRINIVIL,ZESTRIL) 20 MG tablet Take 20 mg by mouth daily.    methocarbamol (ROBAXIN) 500 MG tablet Take 500 mg by mouth every 8 (eight) hours as needed for muscle spasms.    omeprazole (PRILOSEC) 20 MG capsule Take 20 mg by mouth daily.    primidone (MYSOLINE) 50 MG tablet Take by mouth 2 (two) times daily.    solifenacin (VESICARE) 5 MG tablet Take 5 mg by mouth daily.    traMADol (ULTRAM) 50 MG tablet Take 50 mg by mouth every 6 (six) hours as needed for moderate pain.     HYDROcodone-acetaminophen (NORCO) 5-325 MG per tablet Take 2 tablets by mouth every 4 (four) hours as needed. Qty: 15 tablet, Refills: 0      STOP taking these medications     predniSONE (DELTASONE) 10 MG tablet         ALLERGIES:   Allergies  Allergen Reactions  . Macrolides And Ketolides Hives  . Penicillins Hives  . Sulfa Antibiotics   . Amoxicillin Rash    BRIEF HPI:  See H&P, Labs, Consult and Test  reports for all details in brief, patient is a 38 year old female with a history of diabetes, hypertension, chronic pain syndrome who presented to the ED for evaluation of severe headache.  CONSULTATIONS:   neurology  PERTINENT RADIOLOGIC STUDIES: Ct Head Wo Contrast  03/19/2014   CLINICAL DATA:  Headache, nausea and dizziness since this morning.  EXAM: CT HEAD WITHOUT CONTRAST  TECHNIQUE: Contiguous axial images were obtained from the base of the skull through the vertex without intravenous contrast.  COMPARISON:  None.  FINDINGS: The ventricles are normal in size and configuration. No extra-axial fluid collections are identified. The gray-Cotten differentiation is normal. No CT findings for acute intracranial process such as  hemorrhage or infarction. No mass lesions. The brainstem and cerebellum are grossly normal.  The bony structures are intact. The paranasal sinuses and mastoid air cells are clear. The globes are intact.  IMPRESSION: Normal head CT.   Electronically Signed   By: Kalman Jewels M.D.   On: 03/19/2014 13:24   Mr Jodene Nam Head Wo Contrast  03/20/2014   CLINICAL DATA:  38 year old female with severe headache associated with photophobia, nausea, right lower extremity weakness. Initial encounter.  EXAM: MRI HEAD WITHOUT CONTRAST  MRA HEAD WITHOUT CONTRAST  MRV HEAD WITHOUT CONTRAST  TECHNIQUE: Multiplanar, multiecho pulse sequences of the brain and surrounding structures were obtained without intravenous contrast. Angiographic images of the head were obtained using both MRA and MRV technique without contrast.  COMPARISON:  Head CTs without contrast 03/19/2014 and earlier.  FINDINGS: MRI HEAD FINDINGS  Cerebral volume is normal. No restricted diffusion to suggest acute infarction. No midline shift, mass effect, evidence of mass lesion, ventriculomegaly, extra-axial collection or acute intracranial hemorrhage. Cervicomedullary junction and pituitary are within normal limits. Negative visualized cervical spine. Major intracranial vascular flow voids are preserved. Pearline Cables and Raygoza matter signal is within normal limits throughout the brain.  Visible internal auditory structures appear normal. Visualized orbit soft tissues are within normal limits. Minor paranasal sinus mucosal thickening. Mastoids are clear. Visualized scalp soft tissues are within normal limits. Normal bone marrow signal.  MRA HEAD FINDINGS  Antegrade flow in the posterior circulation with codominant distal vertebral arteries. Normal vertebrobasilar junction. Small right PICA. Normal AICA origins. Normal basilar artery. Normal SCA and PCA origins. Posterior communicating arteries are diminutive or absent. Bilateral PCA branches are within normal limits.   Antegrade flow in both ICA siphons. Tortuous distal cervical ICAs, more so the right. Tortuous cavernous segment on the left. No siphon stenosis. Normal ophthalmic artery origins. Normal carotid termini, MCA and ACA origins. Tortuous proximal ACA is. Duplicated or fenestrated appearing anterior communicating artery (series 507, image 3). Visualized ACA branches are within normal limits. Visualized bilateral MCA branches are within normal limits.  MRV HEAD FINDINGS  Preserved flow signal in the superior sagittal sinus, torcula, transverse sinuses, straight sinus, vein of Galen, internal cerebral veins, and basal veins of Rosenthal. Codominant transverse sinus anatomy. Preserved flow signal in both sigmoid sinuses and IJ bulbs.  IMPRESSION: 1.  Normal non contrast MRI appearance of the brain. 2.  Negative intracranial MRA. 3. Negative intracranial MRV.   Electronically Signed   By: Lars Pinks M.D.   On: 03/20/2014 08:55   Mr Brain Wo Contrast  03/20/2014   CLINICAL DATA:  37 year old female with severe headache associated with photophobia, nausea, right lower extremity weakness. Initial encounter.  EXAM: MRI HEAD WITHOUT CONTRAST  MRA HEAD WITHOUT CONTRAST  MRV HEAD WITHOUT CONTRAST  TECHNIQUE: Multiplanar, multiecho pulse sequences of  the brain and surrounding structures were obtained without intravenous contrast. Angiographic images of the head were obtained using both MRA and MRV technique without contrast.  COMPARISON:  Head CTs without contrast 03/19/2014 and earlier.  FINDINGS: MRI HEAD FINDINGS  Cerebral volume is normal. No restricted diffusion to suggest acute infarction. No midline shift, mass effect, evidence of mass lesion, ventriculomegaly, extra-axial collection or acute intracranial hemorrhage. Cervicomedullary junction and pituitary are within normal limits. Negative visualized cervical spine. Major intracranial vascular flow voids are preserved. Pearline Cables and Amini matter signal is within normal limits  throughout the brain.  Visible internal auditory structures appear normal. Visualized orbit soft tissues are within normal limits. Minor paranasal sinus mucosal thickening. Mastoids are clear. Visualized scalp soft tissues are within normal limits. Normal bone marrow signal.  MRA HEAD FINDINGS  Antegrade flow in the posterior circulation with codominant distal vertebral arteries. Normal vertebrobasilar junction. Small right PICA. Normal AICA origins. Normal basilar artery. Normal SCA and PCA origins. Posterior communicating arteries are diminutive or absent. Bilateral PCA branches are within normal limits.  Antegrade flow in both ICA siphons. Tortuous distal cervical ICAs, more so the right. Tortuous cavernous segment on the left. No siphon stenosis. Normal ophthalmic artery origins. Normal carotid termini, MCA and ACA origins. Tortuous proximal ACA is. Duplicated or fenestrated appearing anterior communicating artery (series 507, image 3). Visualized ACA branches are within normal limits. Visualized bilateral MCA branches are within normal limits.  MRV HEAD FINDINGS  Preserved flow signal in the superior sagittal sinus, torcula, transverse sinuses, straight sinus, vein of Galen, internal cerebral veins, and basal veins of Rosenthal. Codominant transverse sinus anatomy. Preserved flow signal in both sigmoid sinuses and IJ bulbs.  IMPRESSION: 1.  Normal non contrast MRI appearance of the brain. 2.  Negative intracranial MRA. 3. Negative intracranial MRV.   Electronically Signed   By: Lars Pinks M.D.   On: 03/20/2014 08:55   Mr Mrv Head Wo Cm  03/20/2014   CLINICAL DATA:  38 year old female with severe headache associated with photophobia, nausea, right lower extremity weakness. Initial encounter.  EXAM: MRI HEAD WITHOUT CONTRAST  MRA HEAD WITHOUT CONTRAST  MRV HEAD WITHOUT CONTRAST  TECHNIQUE: Multiplanar, multiecho pulse sequences of the brain and surrounding structures were obtained without intravenous contrast.  Angiographic images of the head were obtained using both MRA and MRV technique without contrast.  COMPARISON:  Head CTs without contrast 03/19/2014 and earlier.  FINDINGS: MRI HEAD FINDINGS  Cerebral volume is normal. No restricted diffusion to suggest acute infarction. No midline shift, mass effect, evidence of mass lesion, ventriculomegaly, extra-axial collection or acute intracranial hemorrhage. Cervicomedullary junction and pituitary are within normal limits. Negative visualized cervical spine. Major intracranial vascular flow voids are preserved. Pearline Cables and Carmicheal matter signal is within normal limits throughout the brain.  Visible internal auditory structures appear normal. Visualized orbit soft tissues are within normal limits. Minor paranasal sinus mucosal thickening. Mastoids are clear. Visualized scalp soft tissues are within normal limits. Normal bone marrow signal.  MRA HEAD FINDINGS  Antegrade flow in the posterior circulation with codominant distal vertebral arteries. Normal vertebrobasilar junction. Small right PICA. Normal AICA origins. Normal basilar artery. Normal SCA and PCA origins. Posterior communicating arteries are diminutive or absent. Bilateral PCA branches are within normal limits.  Antegrade flow in both ICA siphons. Tortuous distal cervical ICAs, more so the right. Tortuous cavernous segment on the left. No siphon stenosis. Normal ophthalmic artery origins. Normal carotid termini, MCA and ACA origins. Tortuous proximal ACA is.  Duplicated or fenestrated appearing anterior communicating artery (series 507, image 3). Visualized ACA branches are within normal limits. Visualized bilateral MCA branches are within normal limits.  MRV HEAD FINDINGS  Preserved flow signal in the superior sagittal sinus, torcula, transverse sinuses, straight sinus, vein of Galen, internal cerebral veins, and basal veins of Rosenthal. Codominant transverse sinus anatomy. Preserved flow signal in both sigmoid sinuses  and IJ bulbs.  IMPRESSION: 1.  Normal non contrast MRI appearance of the brain. 2.  Negative intracranial MRA. 3. Negative intracranial MRV.   Electronically Signed   By: Lars Pinks M.D.   On: 03/20/2014 08:55     PERTINENT LAB RESULTS: CBC:  Recent Labs  03/20/14 0930 03/21/14 0444  WBC 13.4* 11.8*  HGB 12.1 12.1  HCT 37.5 37.3  PLT 321 299   CMET CMP     Component Value Date/Time   NA 137 03/20/2014 0930   K 4.3 03/20/2014 0930   CL 103 03/20/2014 0930   CO2 20 03/20/2014 0930   GLUCOSE 250* 03/20/2014 0930   BUN 9 03/20/2014 0930   CREATININE 0.61 03/20/2014 0930   CALCIUM 8.5 03/20/2014 0930   PROT 6.3 03/20/2014 0930   ALBUMIN 2.9* 03/20/2014 0930   AST 15 03/20/2014 0930   ALT 25 03/20/2014 0930   ALKPHOS 53 03/20/2014 0930   BILITOT 0.3 03/20/2014 0930   GFRNONAA >90 03/20/2014 0930   GFRAA >90 03/20/2014 0930    GFR Estimated Creatinine Clearance: 90 mL/min (by C-G formula based on Cr of 0.61). No results for input(s): LIPASE, AMYLASE in the last 72 hours.  Recent Labs  03/19/14 2052  TROPONINI <0.30   Invalid input(s): POCBNP No results for input(s): DDIMER in the last 72 hours.  Recent Labs  03/20/14 0930  HGBA1C 8.0*    Recent Labs  03/20/14 0930  CHOL 170  HDL 39*  LDLCALC 111*  TRIG 98  CHOLHDL 4.4   No results for input(s): TSH, T4TOTAL, T3FREE, THYROIDAB in the last 72 hours.  Invalid input(s): FREET3 No results for input(s): VITAMINB12, FOLATE, FERRITIN, TIBC, IRON, RETICCTPCT in the last 72 hours. Coags:  Recent Labs  03/20/14 0930  INR 1.12   Microbiology: No results found for this or any previous visit (from the past 240 hour(s)).   BRIEF HOSPITAL COURSE:   Principal Problem: Suspected Status Migrainous: Patient was evaluated on admission by neurology, who felt patient's clinical features were highly suggestive of migraine headache. Given her obesity, benign intracranial hypertension was also entertained-lumbar  puncture was contemplated, initially patient refused as well. But given her response to numerous medications as outlined below, it was ultimately felt that her headaches were really consistent with migraine. - Patient was then empirically given one dose of Decadron, and one dose of Depakene,with some improvement. Patient was then started on a cocktail of Toradol, Benadryl and Reglan with significant improvement but still had ongoing headaches. She was then evaluated by neurology and started on DHE protocol. This morning, her headaches have essentially resolved. This M.D., spoke with neurology M.D.-Dr. Leonel Ramsay who did not have any further recommendations apart from as needed Compazine and Benadryl for headache. Patient has been scheduled to see neurology as an outpatient for further management-please see below.  Active Problems: Leukocytosis: WBC 13.4 on presentation, and decreased to 11.8 by the time of discharge.Suspect secondary to decadron given in ED on 11/18. No signs of infection.  Fibromyalgia/Chronic Pain Syndrome Chronic problem. Continue Enablex, Gabapentin, Primidone, PRN Robaxin, PRN Tramadol, and PRN Norco.  Hypertension: Resume this in a prolonged discharge..    Diabetes Mellitus, type 2: Chronic problem. Continue Novolin 70/30 12 units BID on discharge.  TODAY-DAY OF DISCHARGE:  Subjective:   Danielle Warren today has no headache,no chest abdominal pain,no new weakness tingling or numbness, feels much better wants to go home today.   Objective:   Blood pressure 130/73, pulse 81, temperature 98.1 F (36.7 C), temperature source Oral, resp. rate 20, height 5\' 1"  (1.549 m), weight 77.747 kg (171 lb 6.4 oz), last menstrual period 02/04/2011, SpO2 100 %.  Intake/Output Summary (Last 24 hours) at 03/21/14 1318 Last data filed at 03/21/14 1210  Gross per 24 hour  Intake   1043 ml  Output    250 ml  Net    793 ml   Filed Weights   03/19/14 1132 03/20/14 0147    Weight: 74.39 kg (164 lb) 77.747 kg (171 lb 6.4 oz)    Exam Awake Alert, Oriented *3, No new F.N deficits, Normal affect [place].AT,PERRAL Supple Neck,No JVD, No cervical lymphadenopathy appriciated.  Symmetrical Chest wall movement, Good air movement bilaterally, CTAB RRR,No Gallops,Rubs or new Murmurs, No Parasternal Heave +ve B.Sounds, Abd Soft, Non tender, No organomegaly appriciated, No rebound -guarding or rigidity. No Cyanosis, Clubbing or edema, No new Rash or bruise  DISCHARGE CONDITION: Stable  DISPOSITION: Home  DISCHARGE INSTRUCTIONS:    Activity:  As tolerated   Diet recommendation: Diabetic Diet Heart Healthy diet  Discharge Instructions    Call MD for:  severe uncontrolled pain    Complete by:  As directed      Diet - low sodium heart healthy    Complete by:  As directed      Diet Carb Modified    Complete by:  As directed      Increase activity slowly    Complete by:  As directed            Follow-up Information    Follow up with Steva Colder, PA-C. Schedule an appointment as soon as possible for a visit in 1 week.   Specialty:  Pulmonary Disease   Contact information:   [place] [address] [phone]       Follow up with [facility]. Schedule an appointment as soon as possible for a visit in 2 weeks.   Why:  with a MD of your choosing.   Contact information:   [address] [phone]      Follow up with [place], [place], DO On 03/24/2014.   Specialty:  Neurology   Why:  2:15 pm   Contact information:   [address] [place] [id number] [phone]      Total Time spent on discharge equals 45 minutes.  SignedOren Binet 03/21/2014 1:18 PM

## 2014-03-24 ENCOUNTER — Ambulatory Visit (INDEPENDENT_AMBULATORY_CARE_PROVIDER_SITE_OTHER): Payer: Medicaid Other | Admitting: Neurology

## 2014-03-24 ENCOUNTER — Encounter: Payer: Self-pay | Admitting: Neurology

## 2014-03-24 VITALS — BP 160/106 | HR 106 | Temp 101.1°F | Resp 20 | Ht 61.0 in | Wt 170.0 lb

## 2014-03-24 DIAGNOSIS — R42 Dizziness and giddiness: Secondary | ICD-10-CM

## 2014-03-24 DIAGNOSIS — R509 Fever, unspecified: Secondary | ICD-10-CM

## 2014-03-24 DIAGNOSIS — G43019 Migraine without aura, intractable, without status migrainosus: Secondary | ICD-10-CM

## 2014-03-24 NOTE — Progress Notes (Signed)
NEUROLOGY CONSULTATION NOTE  Danielle Warren MRN: [id number] DOB: 25-Sep-1975  Referring provider: Hospital referral Primary care provider: Belinda Block, PA-C  Reason for consult:  Migraine  HISTORY OF PRESENT ILLNESS: Danielle Warren is a 38 year old left-handed woman with type II diabetes mellitus, hypertension, hyperlipidemia, fibromyalgia and degenerative disc disease who presents for evaluation of migraine.  Records, labs and imaging personally reviewed.  She was admitted to [facility] on 03/19/14 for headache.  She woke up that morning with throbbing headache which progressively got worse during the day.  It was throbbing in the front of her head and squeezing in the back, sides and top of her head.  There was also tingling in the back of her head.  There was associated nausea, photophobia, phonophobia, slight vertigo and some blurred vision with worsening gait.  She rated the headache a 10/10.  CT of the had was normal.  MRI, MRA and MRV of the head were unremarkable.  She was given Decadron, Depakene, Toradol, Benadryl and Reglan which helped but headache persisted.  She was started on DHE protocol, which only eased the pain.  She was found to have WBC 13.4, thought to be secondary to Decadron.  It decreased to 11.8 at discharge.  She was afebrile.  An LP was entertained, but not performed as patient refused and she ultimately responded to medication.  She was discharged on Benadryl and Compazine, which isn't really effective.  She takes tramadol and gabapentin for chronic back and leg pain.  Since discharge, she has had a daily dull headache, 5/10 intensity, lasting all day.  She also has been feeling ill.  She has asthma and feels a little more short of breath.  In the office, her temperature is 101.1, HR 106, and BP 160/106.  She reports a prior history of migraine as a child.  She has baseline right hip problems stemming from a fall in her early 90s.  She also has baseline gait  instability due to diabetic neuropathy.  PAST MEDICAL HISTORY: Past Medical History  Diagnosis Date  . Hypertension   . Hyperlipemia   . Fibromyalgia   . GERD (gastroesophageal reflux disease)   . Kidney stones   . Type II diabetes mellitus   . Asthma   . Chronic bronchitis     "get it about q yr" (03/20/2014)  . Positive TB test   . History of blood transfusion     "after one of my miscarriages"  . Migraine     "had them when I was 7 or 38 yrs old; one day after sleep study on CPAP; one today" (03/20/2014)  . Degenerative disc disease   . Arthritis     "lower back; right hip; maybe my neck" (03/20/2014)  . Chronic back pain     "inbeween shoulders going up into my neck; lower back" (03/20/2014)  . Depression     PAST SURGICAL HISTORY: Past Surgical History  Procedure Laterality Date  . Central venous catheter insertion Left 1999    chest  . Cervical cerclage  1997; 1999  . Cesarean section  1999  . Laparoscopic cholecystectomy  1997  . Lithotripsy  1990's X 1  . Abdominal hysterectomy  10/2012  . Dilation and curettage of uterus  1995; 1996    "miscarriages"  . Tubal ligation  1999  . Central venous catheter removal  06/1997  . Wisdom tooth extraction  1990's?    "all 4 at one time"    MEDICATIONS: Current  Outpatient Prescriptions on File Prior to Visit  Medication Sig Dispense Refill  . diphenhydrAMINE (BENADRYL) 25 mg capsule Take 1 capsule (25 mg total) by mouth every 6 (six) hours as needed (headache along with compazine). 30 capsule 0  . estradiol (ESTRACE) 1 MG tablet Take 1 mg by mouth daily.    Marland Kitchen gabapentin (NEURONTIN) 300 MG capsule Take 2 capsules (600 mg total) by mouth 3 (three) times daily. 90 capsule 0  . insulin NPH-regular (NOVOLIN 70/30) (70-30) 100 UNIT/ML injection Inject 10 Units into the skin 2 (two) times daily with a meal.    . lisinopril (PRINIVIL,ZESTRIL) 20 MG tablet Take 20 mg by mouth daily.    Marland Kitchen omeprazole (PRILOSEC) 20 MG capsule Take  20 mg by mouth daily.    . primidone (MYSOLINE) 50 MG tablet Take by mouth 2 (two) times daily.    . prochlorperazine (COMPAZINE) 10 MG tablet Take 1 tablet (10 mg total) by mouth every 6 (six) hours as needed (headache). 30 tablet 0  . solifenacin (VESICARE) 5 MG tablet Take 5 mg by mouth daily.    . traMADol (ULTRAM) 50 MG tablet Take 50 mg by mouth every 6 (six) hours as needed for moderate pain.     Marland Kitchen HYDROcodone-acetaminophen (NORCO) 5-325 MG per tablet Take 2 tablets by mouth every 4 (four) hours as needed. (Patient not taking: Reported on 03/24/2014) 15 tablet 0  . methocarbamol (ROBAXIN) 500 MG tablet Take 500 mg by mouth every 8 (eight) hours as needed for muscle spasms.     No current facility-administered medications on file prior to visit.    ALLERGIES: Allergies  Allergen Reactions  . Amoxicillin-Pot Clavulanate   . Macrolides And Ketolides Hives  . Nitrofurantoin   . Penicillins Hives  . Sulfa Antibiotics   . Amoxicillin Rash    FAMILY HISTORY: Family History  Problem Relation Age of Onset  . Hypertension Mother   . Diabetes Father   . Diabetes Sister   . Cancer Father     prostate/pancreatic   . COPD Mother   . Lupus Brother   . Asthma Mother   . Asthma Brother   . Asthma Daughter     SOCIAL HISTORY: History   Social History  . Marital Status: Married    Spouse Name: N/A    Number of Children: N/A  . Years of Education: N/A   Occupational History  . Not on file.   Social History Main Topics  . Smoking status: Current Every Day Smoker -- 0.50 packs/day for 17 years    Types: Cigarettes  . Smokeless tobacco: Never Used     Comment: patient is aware she needs to quit   . Alcohol Use: 0.0 oz/week    0 Not specified per week     Comment: 03/20/2014 "maybe once/year I'll have a drink"  . Drug Use: No  . Sexual Activity:    Partners: Male    Birth Control/ Protection: Surgical   Other Topics Concern  . Not on file   Social History Narrative     REVIEW OF SYSTEMS: Constitutional: Fever Eyes: No visual changes, double vision, eye pain Ear, nose and throat: No hearing loss, ear pain, nasal congestion, sore throat Cardiovascular: No chest pain, palpitations Respiratory:  Short of breath GastrointestinaI: No nausea, vomiting, diarrhea, abdominal pain, fecal incontinence Genitourinary:  No dysuria, urinary retention or frequency Musculoskeletal:  Suboccipital neck tenderness Integumentary: No rash, pruritus, skin lesions Neurological: as above Psychiatric: No depression, insomnia, anxiety Endocrine:  No palpitations, fatigue, diaphoresis, mood swings, change in appetite, change in weight, increased thirst Hematologic/Lymphatic:  No anemia, purpura, petechiae. Allergic/Immunologic: no itchy/runny eyes, nasal congestion, recent allergic reactions, rashes  PHYSICAL EXAM: Filed Vitals:   03/24/14 1440  BP: 160/106  Pulse: 106  Temp: 101.1 F (38.4 C)  Resp: 20   General: No acute distress Head:  Normocephalic/atraumatic Eyes:  fundi unremarkable, without vessel changes, exudates, hemorrhages or papilledema. CN III, IV, VI:  full range of motion, no nystagmus, no ptosis Neck: supple (no meningismus), full range of motion Back: No paraspinal tenderness Heart: regular rate and rhythm Lungs: Clear to auscultation bilaterally. Vascular: No carotid bruits. Neurological Exam: Mental status: alert and oriented to person, place, and time, recent and remote memory intact, fund of knowledge intact, attention and concentration intact, speech fluent and not dysarthric, language intact. Cranial nerves: CN I: not tested CN II: pupils equal, round and reactive to light, visual fields intact, fundi unremarkable, without vessel changes, exudates, hemorrhages or papilledema. CN III, IV, VI:  full range of motion, no nystagmus, no ptosis CN V: facial sensation intact CN VII: upper and lower face symmetric CN VIII: hearing intact CN IX, X:  gag intact, uvula midline CN XI: sternocleidomastoid and trapezius muscles intact CN XII: tongue midline Bulk & Tone: normal, no fasciculations. Motor:  5/5 throughout Sensation:  Temperature and vibration intact. Deep Tendon Reflexes:  2+ throughout, toes downgoing Finger to nose testing:  No dysmetria.  Trace intention tremor Gait:  Right limp. Romberg with sway.  IMPRESSION: Migraine, intractable. Fever  She does not exhibit meningismus to suggest meningitis.  I feel the persistent headache could be due to a systemic viral illness  PLAN: In the setting of fever, I really cannot treat for migraine.  The systemic illness really needs to be address first.  I instructed her that she should contact her PCP immediately to be seen.  She says she has an appointment tomorrow, but I told her she should go see them today.  I will be happy to see her once she is recovered.  Thank you for allowing me to take part in the care of this patient.  Metta Clines, DO  CC:  Belinda Block, PA-C

## 2014-03-24 NOTE — Patient Instructions (Signed)
You have a fever with elevated blood pressure and heart rate.  This could be contributing to your headaches.  You need to see your primary care provider ASAP for appropriate treatment.  I will be happy to see you once you have recovered.

## 2014-06-23 DIAGNOSIS — M79604 Pain in right leg: Secondary | ICD-10-CM | POA: Insufficient documentation

## 2014-09-23 ENCOUNTER — Telehealth: Payer: Self-pay | Admitting: Neurology

## 2014-09-23 NOTE — Telephone Encounter (Signed)
Lattie Haw with [facility] [phone] ext [phone]) called in regards to form faxed over for [facility] medicaid for  pt cpap

## 2014-09-23 NOTE — Telephone Encounter (Signed)
I advised Lattie Haw at [facility] we did not order a CPAP for this patient

## 2014-09-24 NOTE — Telephone Encounter (Signed)
error 

## 2015-10-22 ENCOUNTER — Encounter (HOSPITAL_COMMUNITY): Payer: Self-pay | Admitting: Emergency Medicine

## 2015-10-22 ENCOUNTER — Emergency Department (HOSPITAL_COMMUNITY): Payer: Medicaid Other

## 2015-10-22 ENCOUNTER — Emergency Department (HOSPITAL_COMMUNITY)
Admission: EM | Admit: 2015-10-22 | Discharge: 2015-10-22 | Disposition: A | Discharge status: Home / Self Care | Payer: Medicaid Other | Attending: Emergency Medicine | Admitting: Emergency Medicine

## 2015-10-22 ENCOUNTER — Other Ambulatory Visit: Payer: Self-pay

## 2015-10-22 DIAGNOSIS — R51 Headache: Secondary | ICD-10-CM | POA: Insufficient documentation

## 2015-10-22 DIAGNOSIS — R4182 Altered mental status, unspecified: Secondary | ICD-10-CM | POA: Insufficient documentation

## 2015-10-22 DIAGNOSIS — F17210 Nicotine dependence, cigarettes, uncomplicated: Secondary | ICD-10-CM | POA: Diagnosis not present

## 2015-10-22 DIAGNOSIS — Z794 Long term (current) use of insulin: Secondary | ICD-10-CM | POA: Insufficient documentation

## 2015-10-22 DIAGNOSIS — Z79899 Other long term (current) drug therapy: Secondary | ICD-10-CM | POA: Diagnosis not present

## 2015-10-22 DIAGNOSIS — R0789 Other chest pain: Secondary | ICD-10-CM | POA: Diagnosis not present

## 2015-10-22 DIAGNOSIS — R519 Headache, unspecified: Secondary | ICD-10-CM

## 2015-10-22 DIAGNOSIS — I10 Essential (primary) hypertension: Secondary | ICD-10-CM | POA: Insufficient documentation

## 2015-10-22 DIAGNOSIS — J45909 Unspecified asthma, uncomplicated: Secondary | ICD-10-CM | POA: Diagnosis not present

## 2015-10-22 DIAGNOSIS — R079 Chest pain, unspecified: Secondary | ICD-10-CM

## 2015-10-22 DIAGNOSIS — E119 Type 2 diabetes mellitus without complications: Secondary | ICD-10-CM | POA: Insufficient documentation

## 2015-10-22 LAB — CBC
HCT: 40.7 % (ref 36.0–46.0)
Hemoglobin: 13.2 g/dL (ref 12.0–15.0)
MCH: 23.7 pg — ABNORMAL LOW (ref 26.0–34.0)
MCHC: 32.4 g/dL (ref 30.0–36.0)
MCV: 72.9 fL — ABNORMAL LOW (ref 78.0–100.0)
Platelets: 380 10*3/uL (ref 150–400)
RBC: 5.58 MIL/uL — ABNORMAL HIGH (ref 3.87–5.11)
RDW: 13.6 % (ref 11.5–15.5)
WBC: 10.6 10*3/uL — ABNORMAL HIGH (ref 4.0–10.5)

## 2015-10-22 LAB — BASIC METABOLIC PANEL
Anion gap: 9 (ref 5–15)
BUN: <5 mg/dL — ABNORMAL LOW (ref 6–20)
CO2: 22 mmol/L (ref 22–32)
Calcium: 9.1 mg/dL (ref 8.9–10.3)
Chloride: 101 mmol/L (ref 101–111)
Creatinine, Ser: 0.8 mg/dL (ref 0.44–1.00)
GFR calc Af Amer: >60 mL/min (ref >60)
GFR calc non Af Amer: >60 mL/min (ref >60)
Glucose, Bld: 399 mg/dL — ABNORMAL HIGH (ref 65–99)
Potassium: 4 mmol/L (ref 3.5–5.1)
Sodium: 132 mmol/L — ABNORMAL LOW (ref 135–145)

## 2015-10-22 LAB — I-STAT TROPONIN, ED: Troponin i, poc: 0 ng/mL (ref 0.00–0.08)

## 2015-10-22 MED ORDER — KETOROLAC TROMETHAMINE 30 MG/ML IJ SOLN
30.0000 mg | Freq: Once | INTRAMUSCULAR | Status: AC
  Administered 2015-10-22: 30 mg via INTRAVENOUS
  Filled 2015-10-22: qty 1

## 2015-10-22 MED ORDER — SODIUM CHLORIDE 0.9 % IV BOLUS (SEPSIS)
1000.0000 mL | Freq: Once | INTRAVENOUS | Status: AC
  Administered 2015-10-22: 1000 mL via INTRAVENOUS

## 2015-10-22 MED ORDER — METOCLOPRAMIDE HCL 5 MG/ML IJ SOLN
10.0000 mg | Freq: Once | INTRAMUSCULAR | Status: AC
Start: 2015-10-22 — End: 2015-10-22
  Administered 2015-10-22: 10 mg via INTRAVENOUS
  Filled 2015-10-22: qty 2

## 2015-10-22 MED ORDER — DIPHENHYDRAMINE HCL 50 MG/ML IJ SOLN
25.0000 mg | Freq: Once | INTRAMUSCULAR | Status: AC
  Administered 2015-10-22: 25 mg via INTRAVENOUS
  Filled 2015-10-22: qty 1

## 2015-10-22 MED ORDER — DEXAMETHASONE SODIUM PHOSPHATE 10 MG/ML IJ SOLN
10.0000 mg | Freq: Once | INTRAMUSCULAR | Status: AC
  Administered 2015-10-22: 10 mg via INTRAVENOUS
  Filled 2015-10-22: qty 1

## 2015-10-22 NOTE — Discharge Instructions (Signed)
Ibuprofen 600 mg every 6 hours as needed for pain.  Return to the emergency department if your symptoms significantly worsen or change.   General Headache Without Cause A headache is pain or discomfort felt around the head or neck area. The specific cause of a headache may not be found. There are many causes and types of headaches. A few common ones are:  Tension headaches.  Migraine headaches.  Cluster headaches.  Chronic daily headaches. HOME CARE INSTRUCTIONS  Watch your condition for any changes. Take these steps to help with your condition: Managing Pain  Take over-the-counter and prescription medicines only as told by your health care provider.  Lie down in a dark, quiet room when you have a headache.  If directed, apply ice to the head and neck area:  Put ice in a plastic bag.  Place a towel between your skin and the bag.  Leave the ice on for 20 minutes, 2-3 times per day.  Use a heating pad or hot shower to apply heat to the head and neck area as told by your health care provider.  Keep lights dim if bright lights bother you or make your headaches worse. Eating and Drinking  Eat meals on a regular schedule.  Limit alcohol use.  Decrease the amount of caffeine you drink, or stop drinking caffeine. General Instructions  Keep all follow-up visits as told by your health care provider. This is important.  Keep a headache journal to help find out what may trigger your headaches. For example, write down:  What you eat and drink.  How much sleep you get.  Any change to your diet or medicines.  Try massage or other relaxation techniques.  Limit stress.  Sit up straight, and do not tense your muscles.  Do not use tobacco products, including cigarettes, chewing tobacco, or e-cigarettes. If you need help quitting, ask your health care provider.  Exercise regularly as told by your health care provider.  Sleep on a regular schedule. Get 7-9 hours of sleep, or  the amount recommended by your health care provider. SEEK MEDICAL CARE IF:   Your symptoms are not helped by medicine.  You have a headache that is different from the usual headache.  You have nausea or you vomit.  You have a fever. SEEK IMMEDIATE MEDICAL CARE IF:   Your headache becomes severe.  You have repeated vomiting.  You have a stiff neck.  You have a loss of vision.  You have problems with speech.  You have pain in the eye or ear.  You have muscular weakness or loss of muscle control.  You lose your balance or have trouble walking.  You feel faint or pass out.  You have confusion.   This information is not intended to replace advice given to you by your health care provider. Make sure you discuss any questions you have with your health care provider.   Document Released: 04/18/2005 Document Revised: 01/07/2015 Document Reviewed: 08/11/2014 Elsevier Interactive Patient Education [employer].

## 2015-10-22 NOTE — ED Notes (Signed)
Pt woke up this am with bad  h/a sick on stomach , states has been confused and dizzy but drove herself to er, having left arm pain

## 2015-10-22 NOTE — ED Notes (Signed)
Nurse tech first stated patient in xray will be transported to E43 after.

## 2015-10-22 NOTE — ED Provider Notes (Signed)
CSN: [id number]     Arrival date & time 10/22/15  1018 History   First MD Initiated Contact with Patient 10/22/15 1148     Chief Complaint  Patient presents with  . Headache  . Dizziness  . Altered Mental Status  . Chest Pain     (Consider location/radiation/quality/duration/timing/severity/associated sxs/prior Treatment) HPI Comments: Patient is a 40 year old female with past medical history of migraines, diabetes, and fibromyalgia. She presents for evaluation of headache that started sometime in the night. It is worsened she is reporting nausea. She also reports feeling tight in her arm and chest. He denies any injury or trauma. She denies any fevers or chills.  Patient is a 40 y.o. female presenting with headaches, dizziness, altered mental status, and chest pain. The history is provided by the patient.  Headache Pain location:  Generalized Quality:  Dull Radiates to:  Does not radiate Onset quality:  Sudden Duration:  12 hours Timing:  Constant Chronicity:  Recurrent Similar to prior headaches: yes   Relieved by:  Nothing Worsened by:  Nothing Ineffective treatments:  None tried Associated symptoms: dizziness   Dizziness Associated symptoms: chest pain and headaches   Altered Mental Status Associated symptoms: headaches   Chest Pain Associated symptoms: altered mental status, dizziness and headache     Past Medical History  Diagnosis Date  . Hypertension   . Hyperlipemia   . Fibromyalgia   . GERD (gastroesophageal reflux disease)   . Kidney stones   . Type II diabetes mellitus ([place])   . Asthma   . Chronic bronchitis ([place])     "get it about q yr" (03/20/2014)  . Positive TB test   . History of blood transfusion     "after one of my miscarriages"  . Migraine     "had them when I was 7 or 40 yrs old; one day after sleep study on CPAP; one today" (03/20/2014)  . Degenerative disc disease   . Arthritis     "lower back; right hip; maybe my neck" (03/20/2014)  .  Chronic back pain     "inbeween shoulders going up into my neck; lower back" (03/20/2014)  . Depression    Past Surgical History  Procedure Laterality Date  . Central venous catheter insertion Left 1999    chest  . Cervical cerclage  1997; 1999  . Cesarean section  1999  . Laparoscopic cholecystectomy  1997  . Lithotripsy  1990's X 1  . Abdominal hysterectomy  10/2012  . Dilation and curettage of uterus  1995; 1996    "miscarriages"  . Tubal ligation  1999  . Central venous catheter removal  06/1997  . Wisdom tooth extraction  1990's?    "all 4 at one time"   Family History  Problem Relation Age of Onset  . Hypertension Mother   . Diabetes Father   . Diabetes Sister   . Cancer Father     prostate/pancreatic   . COPD Mother   . Lupus Brother   . Asthma Mother   . Asthma Brother   . Asthma Daughter    Social History  Substance Use Topics  . Smoking status: Current Every Day Smoker -- 0.50 packs/day for 17 years    Types: Cigarettes  . Smokeless tobacco: Never Used     Comment: patient is aware she needs to quit   . Alcohol Use: 0.0 oz/week    0 Standard drinks or equivalent per week     Comment: 03/20/2014 "maybe once/year I'll  have a drink"   OB History    No data available     Review of Systems  Cardiovascular: Positive for chest pain.  Neurological: Positive for dizziness and headaches.  All other systems reviewed and are negative.     Allergies  Amoxicillin-pot clavulanate; Macrolides and ketolides; Nitrofurantoin; Penicillins; Sulfa antibiotics; and Amoxicillin  Home Medications   Prior to Admission medications   Medication Sig Start Date End Date Taking? Authorizing Provider  diphenhydrAMINE (BENADRYL) 25 mg capsule Take 1 capsule (25 mg total) by mouth every 6 (six) hours as needed (headache along with compazine). 03/21/14  Yes Shanker Kristeen Mans, MD  ibuprofen (ADVIL,MOTRIN) 200 MG tablet Take 800 mg by mouth every 6 (six) hours as needed for  headache.   Yes Historical Provider, MD  insulin NPH-regular (NOVOLIN 70/30) (70-30) 100 UNIT/ML injection Inject 20 Units into the skin 2 (two) times daily with a meal.    Yes Historical Provider, MD  lisinopril (PRINIVIL,ZESTRIL) 20 MG tablet Take 20 mg by mouth daily.   Yes Historical Provider, MD  gabapentin (NEURONTIN) 300 MG capsule Take 2 capsules (600 mg total) by mouth 3 (three) times daily. Patient not taking: Reported on 10/22/2015 03/21/14   Jonetta Osgood, MD   BP 135/108 mmHg  Pulse 92  Temp(Src) 99 F (37.2 C) (Oral)  Resp 20  SpO2 99%  LMP 02/04/2011 Physical Exam  Constitutional: She is oriented to person, place, and time. She appears well-developed and well-nourished. No distress.  HENT:  Head: Normocephalic and atraumatic.  Mouth/Throat: Oropharynx is clear and moist.  Eyes: EOM are normal. Pupils are equal, round, and reactive to light.  Neck: Normal range of motion. Neck supple.  Cardiovascular: Normal rate and regular rhythm.  Exam reveals no gallop and no friction rub.   No murmur heard. Pulmonary/Chest: Effort normal and breath sounds normal. No respiratory distress. She has no wheezes.  Abdominal: Soft. Bowel sounds are normal. She exhibits no distension. There is no tenderness.  Musculoskeletal: Normal range of motion.  Neurological: She is alert and oriented to person, place, and time. No cranial nerve deficit. She exhibits normal muscle tone. Coordination normal.  Skin: Skin is warm and dry. She is not diaphoretic.  Nursing note and vitals reviewed.   ED Course  Procedures (including critical care time) Labs Review Labs Reviewed  BASIC METABOLIC PANEL - Abnormal; Notable for the following:    Sodium 132 (*)    Glucose, Bld 399 (*)    BUN <5 (*)    All other components within normal limits  CBC - Abnormal; Notable for the following:    WBC 10.6 (*)    RBC 5.58 (*)    MCV 72.9 (*)    MCH 23.7 (*)    All other components within normal limits   I-STAT TROPOININ, ED    Imaging Review Dg Chest 2 View  10/22/2015  CLINICAL DATA:  Left-sided chest pain and upper back pain. Shortness of breath and nausea. EXAM: CHEST  2 VIEW COMPARISON:  11/09/2014 FINDINGS: The heart size and mediastinal contours are within normal limits. Both lungs are clear. The visualized skeletal structures are unremarkable. IMPRESSION: No active cardiopulmonary disease. Electronically Signed   By: Kerby Moors M.D.   On: 10/22/2015 11:18   Ct Head Wo Contrast  10/22/2015  CLINICAL DATA:  Migraine headache. EXAM: CT HEAD WITHOUT CONTRAST TECHNIQUE: Contiguous axial images were obtained from the base of the skull through the vertex without intravenous contrast. COMPARISON:  11/09/2014  FINDINGS: No acute cortical infarct, hemorrhage, or mass lesion ispresent. Ventricles are of normal size. No significant extra-axial fluid collection is present. The paranasal sinuses andmastoid air cells are clear. The osseous skull is intact. IMPRESSION: Normal exam. Electronically Signed   By: Kerby Moors M.D.   On: 10/22/2015 13:18   I have personally reviewed and evaluated these images and lab results as part of my medical decision-making.   EKG Interpretation   Date/Time:  Thursday October 22 2015 10:32:25 EDT Ventricular Rate:  92 PR Interval:  144 QRS Duration: 72 QT Interval:  344 QTC Calculation: 425 R Axis:   67 Text Interpretation:  Normal sinus rhythm Normal ECG Confirmed by Moise Friday   MD, Caira Poche ([id number]) on 10/22/2015 1:37:33 PM      MDM   Final diagnoses:  None    Workup reveals a negative head CT and cardiac workup which is unremarkable. I doubt a cardiac etiology. She will be discharged, to return as needed for any problems. She is feeling better after IV fluids and a migraine cocktail.  Veryl Speak, MD 10/22/15 1339

## 2018-03-22 DIAGNOSIS — N390 Urinary tract infection, site not specified: Secondary | ICD-10-CM | POA: Insufficient documentation

## 2018-03-22 DIAGNOSIS — N3000 Acute cystitis without hematuria: Secondary | ICD-10-CM | POA: Insufficient documentation

## 2019-02-18 DIAGNOSIS — R079 Chest pain, unspecified: Secondary | ICD-10-CM | POA: Insufficient documentation

## 2019-09-20 DIAGNOSIS — R1030 Lower abdominal pain, unspecified: Secondary | ICD-10-CM | POA: Insufficient documentation

## 2019-09-20 DIAGNOSIS — E139 Other specified diabetes mellitus without complications: Secondary | ICD-10-CM | POA: Insufficient documentation

## 2019-09-20 DIAGNOSIS — N838 Other noninflammatory disorders of ovary, fallopian tube and broad ligament: Secondary | ICD-10-CM | POA: Insufficient documentation

## 2019-11-28 ENCOUNTER — Other Ambulatory Visit: Payer: Self-pay | Admitting: Family Medicine

## 2019-11-28 ENCOUNTER — Ambulatory Visit
Admission: RE | Admit: 2019-11-28 | Discharge: 2019-11-28 | Disposition: A | Discharge status: Home / Self Care | Payer: 59 | Source: Ambulatory Visit | Attending: Family Medicine | Admitting: Family Medicine

## 2019-11-28 DIAGNOSIS — M5489 Other dorsalgia: Secondary | ICD-10-CM

## 2019-12-05 ENCOUNTER — Ambulatory Visit (INDEPENDENT_AMBULATORY_CARE_PROVIDER_SITE_OTHER): Payer: 59 | Admitting: Family Medicine

## 2019-12-05 ENCOUNTER — Other Ambulatory Visit: Payer: Self-pay

## 2019-12-05 ENCOUNTER — Encounter: Payer: Self-pay | Admitting: Family Medicine

## 2019-12-05 VITALS — Ht 62.0 in | Wt 147.0 lb

## 2019-12-05 DIAGNOSIS — M5416 Radiculopathy, lumbar region: Secondary | ICD-10-CM

## 2019-12-05 DIAGNOSIS — M25541 Pain in joints of right hand: Secondary | ICD-10-CM

## 2019-12-05 DIAGNOSIS — M5412 Radiculopathy, cervical region: Secondary | ICD-10-CM

## 2019-12-05 NOTE — Patient Instructions (Signed)
Nice to meet you Please continue the heat  Please try the exercises   You can try adding the duexis to your regimen.  I will call with the lab results from today  Please send me a message in MyChart with any questions or updates.  We will set up a virtual visit once the MRI is resulted.   --Dr. Raeford Razor

## 2019-12-05 NOTE — Progress Notes (Signed)
Danielle Warren - 44 y.o. female MRN [id number]  Date of birth: 1976-02-27  SUBJECTIVE:  Including CC & ROS.  Chief Complaint  Patient presents with  . Joint Pain    Danielle Warren is a 44 y.o. female that is presenting with acute on chronic neck pain, low back pain and and pain in multiple joint pain.  She has a history of pain that has been ongoing for several years.  She has had some other work-up but denies improvement of her symptoms as far.  She is currently going to a pain clinic.  She denies any specific injury or inciting event.  Pain seems to be worse with any form of movement.   Review of Systems See HPI   HISTORY: Past Medical, Surgical, Social, and Family History Reviewed & Updated per EMR.   Pertinent Historical Findings include:  Past Medical History:  Diagnosis Date  . Arthritis    "lower back; right hip; maybe my neck" (03/20/2014)  . Asthma   . Chronic back pain    "inbeween shoulders going up into my neck; lower back" (03/20/2014)  . Chronic bronchitis ([place])    "get it about q yr" (03/20/2014)  . Degenerative disc disease   . Depression   . Fibromyalgia   . GERD (gastroesophageal reflux disease)   . History of blood transfusion    "after one of my miscarriages"  . Hyperlipemia   . Hypertension   . Kidney stones   . Migraine    "had them when I was 7 or 44 yrs old; one day after sleep study on CPAP; one today" (03/20/2014)  . Positive TB test   . Type II diabetes mellitus ([place])     Past Surgical History:  Procedure Laterality Date  . ABDOMINAL HYSTERECTOMY  10/2012  . CENTRAL VENOUS CATHETER INSERTION Left 1999   chest  . CENTRAL VENOUS CATHETER REMOVAL  06/1997  . CERVICAL CERCLAGE  1997; 1999  . CESAREAN SECTION  1999  . DILATION AND CURETTAGE OF UTERUS  1995; 1996   "miscarriages"  . LAPAROSCOPIC CHOLECYSTECTOMY  1997  . LITHOTRIPSY  1990's X 1  . TUBAL LIGATION  1999  . WISDOM TOOTH EXTRACTION  1990's?   "all 4 at one time"    Family  History  Problem Relation Age of Onset  . Hypertension Mother   . Diabetes Father   . Diabetes Sister   . Cancer Father        prostate/pancreatic   . COPD Mother   . Lupus Brother   . Asthma Mother   . Asthma Brother   . Asthma Daughter     Social History   Socioeconomic History  . Marital status: Married    Spouse name: Not on file  . Number of children: Not on file  . Years of education: Not on file  . Highest education level: Not on file  Occupational History  . Not on file  Tobacco Use  . Smoking status: Current Every Day Smoker    Packs/day: 0.50    Years: 17.00    Pack years: 8.50    Types: Cigarettes  . Smokeless tobacco: Never Used  . Tobacco comment: patient is aware she needs to quit   Substance and Sexual Activity  . Alcohol use: Yes    Alcohol/week: 0.0 standard drinks    Comment: 03/20/2014 "maybe once/year I'll have a drink"  . Drug use: No  . Sexual activity: Yes    Partners: Male  Birth control/protection: Surgical  Other Topics Concern  . Not on file  Social History Narrative  . Not on file   Social Determinants of Health   Financial Resource Strain:   . Difficulty of Paying Living Expenses:   Food Insecurity:   . Worried About Charity fundraiser in the Last Year:   . Arboriculturist in the Last Year:   Transportation Needs:   . Film/video editor (Medical):   Marland Kitchen Lack of Transportation (Non-Medical):   Physical Activity:   . Days of Exercise per Week:   . Minutes of Exercise per Session:   Stress:   . Feeling of Stress :   Social Connections:   . Frequency of Communication with Friends and Family:   . Frequency of Social Gatherings with Friends and Family:   . Attends Religious Services:   . Active Member of Clubs or Organizations:   . Attends Archivist Meetings:   Marland Kitchen Marital Status:   Intimate Partner Violence:   . Fear of Current or Ex-Partner:   . Emotionally Abused:   Marland Kitchen Physically Abused:   . Sexually Abused:       PHYSICAL EXAM:  VS: Ht 5\' 2"  (1.575 m)   Wt 147 lb (66.7 kg)   LMP 02/04/2011   BMI 26.89 kg/m  Physical Exam Gen: NAD, alert, cooperative with exam, well-appearing MSK:  Neck: Limited flexion and extension. Normal lateral rotation. Normal shoulder range of motion. Back: Pain with flexion and extension. Positive straight leg raise. Right hand: No ecchymosis. Normal grip strength. Neurovascularly intact     ASSESSMENT & PLAN:   Cervical radiculopathy Reports a history of neck pain that has been occurring for years.  Review of the cervical spine showing degenerative changes C5-6.  Review of the thoracic spine x-ray shows no acute changes. -Counseled supportive care. -MRI to evaluate for nerve impingement.  Lumbar radiculopathy Reports incontinence and significant pain that is radicular in nature.  Has been going to pain clinic.  Currently on narcotic and gabapentin.  Review of the lumbar spine is showing facet degeneration at L5-S1. -Counseled on supportive care. -MRI to evaluate for nerve impingement or cauda equina syndrome.  Arthralgia of right hand Multiple joint pain throughout.  Possible for inflammatory process.  I have component of fibromyalgia as well. -Counseled supportive care. -Provided Duexis samples. -ANA, sed rate and CRP.

## 2019-12-06 DIAGNOSIS — M5412 Radiculopathy, cervical region: Secondary | ICD-10-CM | POA: Insufficient documentation

## 2019-12-06 DIAGNOSIS — M25541 Pain in joints of right hand: Secondary | ICD-10-CM | POA: Insufficient documentation

## 2019-12-06 DIAGNOSIS — M5416 Radiculopathy, lumbar region: Secondary | ICD-10-CM | POA: Insufficient documentation

## 2019-12-06 NOTE — Assessment & Plan Note (Addendum)
Reports a history of neck pain that has been occurring for years.  Review of the cervical spine showing degenerative changes C5-6.  Review of the thoracic spine x-ray shows no acute changes. -Counseled supportive care. -MRI to evaluate for nerve impingement.

## 2019-12-06 NOTE — Assessment & Plan Note (Signed)
Reports incontinence and significant pain that is radicular in nature.  Has been going to pain clinic.  Currently on narcotic and gabapentin.  Review of the lumbar spine is showing facet degeneration at L5-S1. -Counseled on supportive care. -MRI to evaluate for nerve impingement or cauda equina syndrome.

## 2019-12-06 NOTE — Assessment & Plan Note (Signed)
Multiple joint pain throughout.  Possible for inflammatory process.  I have component of fibromyalgia as well. -Counseled supportive care. -Provided Duexis samples. -ANA, sed rate and CRP.

## 2019-12-06 NOTE — Progress Notes (Signed)
Medication Samples have been provided to the patient.  Drug name: Duexis       Strength: 800mg /26.6mg         Qty: 2 boxes  LOT: [id number]  Exp.Date: 07/2020  Dosing instructions: take 1 tablet by mouth three (3) times a day.  The patient has been instructed regarding the correct time, dose, and frequency of taking this medication, including desired effects and most common side effects.   Sherrie George, MA 8:10 AM 12/06/2019

## 2019-12-07 LAB — ANA,IFA RA DIAG PNL W/RFLX TIT/PATN
ANA Titer 1: NEGATIVE
Cyclic Citrullin Peptide Ab: 6 units (ref 0–19)
Rhuematoid fact SerPl-aCnc: <10 IU/mL (ref 0.0–13.9)

## 2019-12-07 LAB — C-REACTIVE PROTEIN: CRP: 3 mg/L (ref 0–10)

## 2019-12-07 LAB — SEDIMENTATION RATE: Sed Rate: 46 mm/hr — ABNORMAL HIGH (ref 0–32)

## 2019-12-09 ENCOUNTER — Telehealth: Payer: Self-pay | Admitting: Family Medicine

## 2019-12-09 ENCOUNTER — Encounter: Payer: Self-pay | Admitting: Gastroenterology

## 2019-12-09 NOTE — Telephone Encounter (Signed)
Left VM for patient. If she calls back please have her speak with a nurse/CMA and inform that her sedimentation rate was the only lab that was abnormal.  This is nonspecific for a longer course of inflammatory change.  We will have to wait and see what the MRI reveals to see if this correlates with anything specifically..   If any questions then please take the best time and phone number to call and I will try to call her back.   Rosemarie Ax, MD [facility] 12/09/2019, 8:40 AM

## 2019-12-12 ENCOUNTER — Ambulatory Visit: Payer: 59 | Admitting: Family Medicine

## 2019-12-12 ENCOUNTER — Telehealth: Payer: Self-pay | Admitting: Family Medicine

## 2019-12-12 MED ORDER — DIAZEPAM 5 MG PO TABS
ORAL_TABLET | ORAL | 0 refills | Status: DC
Start: 2019-12-12 — End: 2020-01-09

## 2019-12-12 NOTE — Telephone Encounter (Signed)
Pt scheduled for MRI on Sat 8/14 & is requesting a Rx for calming (she is claustrophobic) & anxious.   --pt uses :   [facility] [place] Phone:  [phone]  Fax:  [phone]     --glh

## 2019-12-12 NOTE — Telephone Encounter (Signed)
Provided valium for MRI.   Rosemarie Ax, MD [facility] 12/12/2019, 5:19 PM

## 2019-12-15 ENCOUNTER — Ambulatory Visit (INDEPENDENT_AMBULATORY_CARE_PROVIDER_SITE_OTHER): Payer: 59

## 2019-12-15 ENCOUNTER — Other Ambulatory Visit: Payer: Self-pay

## 2019-12-15 DIAGNOSIS — M5416 Radiculopathy, lumbar region: Secondary | ICD-10-CM

## 2019-12-15 DIAGNOSIS — M5412 Radiculopathy, cervical region: Secondary | ICD-10-CM | POA: Diagnosis not present
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
## 2019-12-16 ENCOUNTER — Ambulatory Visit: Payer: 59 | Admitting: Neurology

## 2019-12-16 ENCOUNTER — Telehealth: Payer: Self-pay | Admitting: Neurology

## 2019-12-16 ENCOUNTER — Encounter: Payer: Self-pay | Admitting: Neurology

## 2019-12-16 NOTE — Telephone Encounter (Signed)
Patient no-showed. Needs approval prior to reschedule

## 2019-12-17 ENCOUNTER — Telehealth (INDEPENDENT_AMBULATORY_CARE_PROVIDER_SITE_OTHER): Payer: 59 | Admitting: Family Medicine

## 2019-12-17 ENCOUNTER — Other Ambulatory Visit: Payer: Self-pay

## 2019-12-17 DIAGNOSIS — M5412 Radiculopathy, cervical region: Secondary | ICD-10-CM | POA: Diagnosis not present

## 2019-12-17 DIAGNOSIS — M5416 Radiculopathy, lumbar region: Secondary | ICD-10-CM | POA: Diagnosis not present

## 2019-12-17 NOTE — Assessment & Plan Note (Signed)
MRI was revealing for spinal stenosis in different levels of disc bulging.  Still in significant pain has done conservative measures thus far without improvement. -Counseled on supportive care. -Referral to neurosurgery.

## 2019-12-17 NOTE — Progress Notes (Signed)
Virtual Visit via Video Note  I connected with Danielle Warren on 12/17/19 at  1:30 PM EDT by a video enabled telemedicine application and verified that I am speaking with the correct person using two identifiers.   I discussed the limitations of evaluation and management by telemedicine and the availability of in person appointments. The patient expressed understanding and agreed to proceed.  Physician: office Patient: home  History of Present Illness:  Danielle Warren is a 44 yo F that is presenting with ongoing low back pain with radicular symptoms as well as cervical radiculopathy.  She reports previous episodes of epidurals and she was told not to receive those injections again.  She continues to have significant pain in the upper extremities and neck.  Has pain in lower back and lower extremities.   MRI of the lumbar spine was revealing for a concentric disc bulge and facet hypertrophy at L5-S1.  It is also showing foraminal stenosis bilaterally at L5.  MRI of the cervical spine was showing a disc protrusion at C4-5 with moderate spinal stenosis and disc protrusion at C5-6 with mild spinal stenosis.  Observations/Objective:   Assessment and Plan:  Cervical radiculopathy: MRI was revealing for spinal stenosis in different levels of disc bulging.  Still in significant pain has done conservative measures thus far without improvement. -Counseled on supportive care. -Referral to neurosurgery.  Lumbar radiculopathy: Pain is ongoing and severe.  Seems to be getting worse.  She has tried physical therapy in the past.  She was told not to receive any further epidural injections from her prior treatment team. -Counseled supportive care. -Referral to neurosurgery.   Follow Up Instructions:    I discussed the assessment and treatment plan with the patient. The patient was provided an opportunity to ask questions and all were answered. The patient agreed with the plan and demonstrated an  understanding of the instructions.   The patient was advised to call back or seek an in-person evaluation if the symptoms worsen or if the condition fails to improve as anticipated.  I provided 6 minutes of non-face-to-face time during this encounter.   Clearance Coots, MD

## 2019-12-17 NOTE — Assessment & Plan Note (Signed)
Pain is ongoing and severe.  Seems to be getting worse.  She has tried physical therapy in the past.  She was told not to receive any further epidural injections from her prior treatment team. -Counseled supportive care. -Referral to neurosurgery.

## 2019-12-18 ENCOUNTER — Telehealth: Payer: Self-pay | Admitting: Family Medicine

## 2019-12-18 NOTE — Telephone Encounter (Signed)
Spoke with patient about note needed for work. Provided work note.   Rosemarie Ax, MD [facility] 12/18/2019, 3:10 PM

## 2019-12-18 NOTE — Telephone Encounter (Signed)
Patient called states needs Work Engineer, maintenance of work notes for :   [facility] P) [phone] F) [place] P0 [phone] F) [phone]  --glh

## 2019-12-20 ENCOUNTER — Other Ambulatory Visit: Payer: Self-pay

## 2019-12-20 ENCOUNTER — Ambulatory Visit (INDEPENDENT_AMBULATORY_CARE_PROVIDER_SITE_OTHER): Payer: 59

## 2019-12-20 ENCOUNTER — Ambulatory Visit: Payer: 59 | Admitting: Podiatry

## 2019-12-20 DIAGNOSIS — E1165 Type 2 diabetes mellitus with hyperglycemia: Secondary | ICD-10-CM | POA: Diagnosis not present

## 2019-12-20 DIAGNOSIS — M2142 Flat foot [pes planus] (acquired), left foot: Secondary | ICD-10-CM | POA: Diagnosis not present

## 2019-12-20 DIAGNOSIS — M79672 Pain in left foot: Secondary | ICD-10-CM

## 2019-12-20 DIAGNOSIS — M2141 Flat foot [pes planus] (acquired), right foot: Secondary | ICD-10-CM

## 2019-12-20 DIAGNOSIS — E1142 Type 2 diabetes mellitus with diabetic polyneuropathy: Secondary | ICD-10-CM

## 2019-12-20 DIAGNOSIS — B351 Tinea unguium: Secondary | ICD-10-CM

## 2019-12-20 DIAGNOSIS — Z9989 Dependence on other enabling machines and devices: Secondary | ICD-10-CM | POA: Insufficient documentation

## 2019-12-20 DIAGNOSIS — M79671 Pain in right foot: Secondary | ICD-10-CM | POA: Diagnosis not present

## 2019-12-20 DIAGNOSIS — M5417 Radiculopathy, lumbosacral region: Secondary | ICD-10-CM

## 2019-12-20 NOTE — Patient Instructions (Signed)
Look for Urea cream 40% over the counter at the pharmacy or on [place] to apply to the callused areas daily to soften  Discuss the pain in your leg and feet with the neurosurgeon   Discuss switching to Lyrica with your pain management doctor

## 2019-12-23 NOTE — Progress Notes (Signed)
  Subjective:  Patient ID: Danielle Warren, female    DOB: 09-07-1975,  MRN: [id number]  Chief Complaint  Patient presents with  . Nail Problem    Bilateral 1st toenails medial borders ingrown "I cut them out myself sometimes, they are painful"  . Foot Pain    Bilateral generalized foot and ankle pain    44 y.o. female presents with the above complaint. History confirmed with patient.  She has burning tingling pain, this is often worse at night for her.  She has a history of low back pain and sciatica.  She says seeing a spine specialist as well as a pain management doctor.  They have tried numerous medications including gabapentin which she is now on a maximum dose for this.  She has had Lyrica many years ago and this was also somewhat helpful.  She also complains of pain in the nails where they start to do again and grow into the toenails as well as calluses on the underside of the foot bilaterally.  Objective:  Physical Exam: warm, good capillary refill, no trophic changes or ulcerative lesions, normal DP and PT pulses, onychomycosis, reduced sensation at distal tips of toes and soles of feet and hyperkeratosis submet 5 bilaterally.  Radiographs: X-ray of both feet and ankles: no fracture, dislocation, swelling or degenerative changes noted and No acute radiographic abnormalities to explain the patient's symptoms Assessment:   1. Pain in right foot   2. Pain in left foot   3. Onychomycosis   4. Uncontrolled type 2 diabetes mellitus with hyperglycemia ([place])   5. Diabetic peripheral neuropathy ([facility])   6. Lumbosacral radiculopathy      Plan:  Patient was evaluated and treated and all questions answered.   -Discussed with her that that majority of her pain is likely secondary to lumbosacral radiculopathy as well as diabetic peripheral neuropathy.  She should continue to work with her spine specialist as well as her pain management doctors to find a comfortable regimen and treatment  plan that is going to alleviate her symptoms.  I discussed with her that it is possible that she may never be completely pain-free but hopefully has a reduction in pain and increase in function after this.  She will discuss with them if she should return to Lyrica use instead of gabapentin.  -Patient educated on diabetes. Discussed proper diabetic foot care and discussed risks and complications of disease. Educated patient in depth on reasons to return to the office immediately should he/she discover anything concerning or new on the feet. All questions answered. Discussed proper shoes as well.  Recommended she get diabetic shoes with multidensity accommodative inserts   -Discussed the etiology and treatment options for the condition in detail with the patient. Educated patient on the topical and oral treatment options for mycotic nails. Recommended debridement of the nails today. Sharp and mechanical debridement performed of all painful and mycotic nails today. Nails debrided in length and thickness using a nail nipper and a mechanical burr to level of comfort. Discussed treatment options including appropriate shoe gear. Follow up as needed for painful nails.    Return in about 3 months (around 03/21/2020) for [facility].

## 2019-12-25 ENCOUNTER — Telehealth: Payer: Self-pay | Admitting: Family Medicine

## 2019-12-25 NOTE — Telephone Encounter (Signed)
Patient called states her OV w/ Specialist isn't till later next month & she is out of the sample medication given by provider @ last OV ( unsure of medication name but says it was 800MG   & name of it started with a (D).  --Forwarding message to med asst for review w/provider if additional samples can be given or if Rx can be called into her pharmacy :  [place], [place] Phone:  [phone]  Fax:  [phone]     --glh

## 2020-01-09 ENCOUNTER — Ambulatory Visit (INDEPENDENT_AMBULATORY_CARE_PROVIDER_SITE_OTHER): Payer: 59 | Admitting: Internal Medicine

## 2020-01-09 ENCOUNTER — Other Ambulatory Visit: Payer: Self-pay

## 2020-01-09 ENCOUNTER — Encounter: Payer: Self-pay | Admitting: Internal Medicine

## 2020-01-09 VITALS — BP 130/80 | HR 114 | Temp 97.3°F | Ht 61.0 in | Wt 150.8 lb

## 2020-01-09 DIAGNOSIS — R0600 Dyspnea, unspecified: Secondary | ICD-10-CM | POA: Diagnosis not present

## 2020-01-09 DIAGNOSIS — E782 Mixed hyperlipidemia: Secondary | ICD-10-CM | POA: Diagnosis not present

## 2020-01-09 DIAGNOSIS — R0609 Other forms of dyspnea: Secondary | ICD-10-CM

## 2020-01-09 DIAGNOSIS — R000 Tachycardia, unspecified: Secondary | ICD-10-CM | POA: Diagnosis not present

## 2020-01-09 MED ORDER — METOPROLOL TARTRATE 25 MG PO TABS: 25.0000 mg | ORAL_TABLET | Freq: Two times a day (BID) | ORAL | 3 refills | Status: AC

## 2020-01-09 NOTE — Progress Notes (Signed)
OFFICE CONSULT NOTE  Chief Complaint:  Establish cardiologist  Primary Care Physician: Drue Flirt, MD  HPI:  Danielle Warren is a 44 y.o. female who is being seen today for the evaluation of tachycardia at the request of Letta Kocher, MD.  This is a 44 year old female with a history of multiple medical problems including hypertension, dyslipidemia, kidney stones, depression, fibromyalgia, chronic pain, kidney stones and type 2 diabetes with a strong family history of heart disease, diabetes and hypertension in multiple relatives, who presents today for evaluation of tachycardia. She reports a longstanding history of chest pain and was last evaluated at [facility] for this, but left AMA from the ER. Prior to that, she was seen by [facility] cardiology in 01/2019, who performed a lexiscan myoview stress test which was reportedly negative for ischemia and showed normal LV function. She has had persistent sinus tachycardia for some time. She reports fatigue and shortness of breath with exertion. She denies syncope.   PMHx:  Past Medical History:  Diagnosis Date  . Arthritis    "lower back; right hip; maybe my neck" (03/20/2014)  . Asthma   . Chronic back pain    "inbeween shoulders going up into my neck; lower back" (03/20/2014)  . Chronic bronchitis ([place])    "get it about q yr" (03/20/2014)  . Degenerative disc disease   . Depression   . Fibromyalgia   . GERD (gastroesophageal reflux disease)   . History of blood transfusion    "after one of my miscarriages"  . Hyperlipemia   . Hypertension   . Kidney stones   . Migraine    "had them when I was 7 or 44 yrs old; one day after sleep study on CPAP; one today" (03/20/2014)  . Positive TB test   . Type II diabetes mellitus ([place])     Past Surgical History:  Procedure Laterality Date  . ABDOMINAL HYSTERECTOMY  10/2012  . CENTRAL VENOUS CATHETER INSERTION Left 1999   chest  . CENTRAL VENOUS CATHETER REMOVAL  06/1997   . CERVICAL CERCLAGE  1997; 1999  . CESAREAN SECTION  1999  . DILATION AND CURETTAGE OF UTERUS  1995; 1996   "miscarriages"  . LAPAROSCOPIC CHOLECYSTECTOMY  1997  . LITHOTRIPSY  1990's X 1  . TUBAL LIGATION  1999  . WISDOM TOOTH EXTRACTION  1990's?   "all 4 at one time"    FAMHx:  Family History  Problem Relation Age of Onset  . Hypertension Mother   . Diabetes Father   . Diabetes Sister   . Cancer Father        prostate/pancreatic   . COPD Mother   . Lupus Brother   . Asthma Mother   . Asthma Brother   . Asthma Daughter     SOCHx:   reports that she has been smoking cigarettes. She has a 8.50 pack-year smoking history. She has never used smokeless tobacco. She reports current alcohol use. She reports that she does not use drugs.  ALLERGIES:  Allergies  Allergen Reactions  . Amoxicillin-Pot Clavulanate   . Cephalosporins Other (See Comments)  . Doxycycline Other (See Comments)  . Macrolides And Ketolides Hives  . Nitrofurantoin   . Penicillins Hives  . Sulfa Antibiotics   . Sulfamethoxazole-Trimethoprim Other (See Comments)  . Sulfasalazine Other (See Comments)  . Amoxicillin Rash  . Sulfur Hives  . Telbivudine Hives    ROS: Pertinent items noted in HPI and remainder of comprehensive ROS otherwise negative.  HOME MEDS: Current Outpatient Medications on File Prior to Visit  Medication Sig Dispense Refill  . Albiglutide (TANZEUM) 50 MG PEN INJECT  1 SYRINGE [place] Q WEEK    . albuterol (VENTOLIN HFA) 108 (90 Base) MCG/ACT inhaler Inhale into the lungs.    Marland Kitchen amLODipine (NORVASC) 10 MG tablet     . atorvastatin (LIPITOR) 20 MG tablet Take by mouth.    Marland Kitchen atorvastatin (LIPITOR) 40 MG tablet Take 40 mg by mouth at bedtime.    . BD VEO INSULIN SYRINGE U/F 31G X 15/64" 0.5 ML MISC     . cetirizine (ZYRTEC) 10 MG tablet Take 10 mg by mouth daily.    . Cholecalciferol 50 MCG (2000 UT) CAPS Take 1 capsule by mouth daily.    . cyclobenzaprine (FLEXERIL) 10 MG tablet  SMARTSIG:1 Tablet(s) By Mouth 1 to 3 Times Daily    . diphenhydrAMINE (BENADRYL) 25 mg capsule Take 1 capsule (25 mg total) by mouth every 6 (six) hours as needed (headache along with compazine). 30 capsule 0  . DULoxetine (CYMBALTA) 30 MG capsule Take 30 mg by mouth daily.    Marland Kitchen FLECTOR 1.3 % PTCH APPLY 1 PATCH TOPICALLY EVERY DAY 12 HOURS ON THEN REMOVE FOR 12 HOURS    . fluconazole (DIFLUCAN) 150 MG tablet TK ONE T PO TODAY THEN REPEAT IN 3 DAYS PRN    . gabapentin (NEURONTIN) 300 MG capsule Take 2 capsules (600 mg total) by mouth 3 (three) times daily. 90 capsule 0  . glipiZIDE (GLUCOTROL) 10 MG tablet     . ibuprofen (ADVIL,MOTRIN) 200 MG tablet Take 800 mg by mouth every 6 (six) hours as needed for headache.    . insulin NPH-regular (NOVOLIN 70/30) (70-30) 100 UNIT/ML injection Inject 20 Units into the skin 2 (two) times daily with a meal.     . LEVEMIR 100 UNIT/ML injection Inject into the skin.    . mirtazapine (REMERON) 15 MG tablet Take 7.5 mg by mouth daily.    . mupirocin ointment (BACTROBAN) 2 % SMARTSIG:Sparingly Topical 3 Times Daily PRN    . naloxone (NARCAN) 4 MG/0.1ML LIQD nasal spray kit     . ondansetron (ZOFRAN) 4 MG tablet     . oxyCODONE-acetaminophen (PERCOCET) 7.5-325 MG tablet Take 1 tablet by mouth 4 (four) times daily.    Marland Kitchen OZEMPIC, 0.25 OR 0.5 MG/DOSE, 2 MG/1.5ML SOPN Inject into the skin.    Marland Kitchen PHENADOZ 25 MG suppository INSERT 1 SUPPOSITORY RECTALLY EVERY 6 HOURS AS NEEDED FOR 10 DAYS    . polyethylene glycol powder (GLYCOLAX/MIRALAX) 17 GM/SCOOP powder Take by mouth.    . Semaglutide, 1 MG/DOSE, (OZEMPIC, 1 MG/DOSE,) 2 MG/1.5ML SOPN Inject into the skin.    Marland Kitchen tiZANidine (ZANAFLEX) 4 MG tablet Take by mouth.    . traZODone (DESYREL) 100 MG tablet Take 100 mg by mouth at bedtime.     No current facility-administered medications on file prior to visit.    LABS/IMAGING: No results found for this or any previous visit (from the past 48 hour(s)). No results  found.  LIPID PANEL:    Component Value Date/Time   CHOL 170 03/20/2014 0930   TRIG 98 03/20/2014 0930   HDL 39 (L) 03/20/2014 0930   CHOLHDL 4.4 03/20/2014 0930   VLDL 20 03/20/2014 0930   LDLCALC 111 (H) 03/20/2014 0930    WEIGHTS: Wt Readings from Last 3 Encounters:  01/09/20 150 lb 12.8 oz (68.4 kg)  12/05/19 147 lb (66.7 kg)  03/24/14  170 lb (77.1 kg)    VITALS: BP 130/80   Pulse (!) 114   Temp (!) 97.3 F (36.3 C)   Ht 5' 1"  (1.549 m)   Wt 150 lb 12.8 oz (68.4 kg)   LMP 02/04/2011   SpO2 99%   BMI 28.49 kg/m   EXAM: General appearance: alert and no distress Neck: no JVD, supple, symmetrical, trachea midline and thyroid not enlarged, symmetric, no tenderness/mass/nodules Lungs: clear to auscultation bilaterally Heart: regular rate and rhythm Abdomen: soft, non-tender; bowel sounds normal; no masses,  no organomegaly Extremities: extremities normal, atraumatic, no cyanosis or edema Pulses: 2+ and symmetric Skin: Skin color, texture, turgor normal. No rashes or lesions Neurologic: Grossly normal Psych: Pleasant  EKG: Sinus tachycardia at 114 - personally reviewed  ASSESSMENT: 1. Inappropriate sinus tachycardia 2. IDDM 3. Dyslipidemia 4. Family history of CAD 5. Fibromyalgia  PLAN: 1.   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 Stress testing was negative last October. Would recommend re-starting metoprolol 25 mg BID. Monitor HR - she should have repeat lipid testing in 3 months and follow-up with me then. She says she has been on high potency atorvastatin, however, her LDL is still quite elevated and concerns me that she may have a familial hyperlipidemia.  Thanks for the kind referral. Follow-up in 3 months.  Pixie Casino, MD, [facility], [place] Director of the Advanced Lipid Disorders &  Cardiovascular Risk Reduction Clinic Diplomate of the American Board of Clinical Lipidology Attending  Cardiologist  Direct Dial: [phone]  Fax: [phone]  Website:  [email or web address] 01/09/2020, 4:44 PM

## 2020-01-09 NOTE — Patient Instructions (Addendum)
Medication Instructions:  START TAKING Metoprolol  Tartrate 25 mg one tablet twice a day   *If you need a refill on your cardiac medications before your next appointment, please call your pharmacy*   Lab Work: FASTING LIPID  - 3 MONTHS  DEC 2021  If you have labs (blood work) drawn today and your tests are completely normal, you will receive your results only by: Marland Kitchen MyChart Message (if you have MyChart) OR . A paper copy in the mail If you have any lab test that is abnormal or we need to change your treatment, we will call you to review the results.   Testing/Procedures: NOT NEEDED   Follow-Up: At [facility], you and your health needs are our priority.  As part of our continuing mission to provide you with exceptional heart care, we have created designated Provider Care Teams.  These Care Teams include your primary Cardiologist (physician) and Advanced Practice Providers (APPs -  Physician Assistants and Nurse Practitioners) who all work together to provide you with the care you need, when you need it.     Your next appointment:    3 MONTHS  The format for your next appointment:   In Person  Provider:   K. Mali Hilty, MD   Other Instructions

## 2020-01-10 ENCOUNTER — Other Ambulatory Visit: Payer: 59 | Admitting: Orthotics

## 2020-01-10 DIAGNOSIS — E1142 Type 2 diabetes mellitus with diabetic polyneuropathy: Secondary | ICD-10-CM | POA: Diagnosis not present

## 2020-01-10 DIAGNOSIS — M2142 Flat foot [pes planus] (acquired), left foot: Secondary | ICD-10-CM | POA: Diagnosis not present

## 2020-01-10 DIAGNOSIS — M2141 Flat foot [pes planus] (acquired), right foot: Secondary | ICD-10-CM | POA: Diagnosis not present

## 2020-01-28 ENCOUNTER — Encounter: Payer: Self-pay | Admitting: Gastroenterology

## 2020-01-28 ENCOUNTER — Ambulatory Visit (INDEPENDENT_AMBULATORY_CARE_PROVIDER_SITE_OTHER): Payer: 59 | Admitting: Gastroenterology

## 2020-01-28 VITALS — BP 126/70 | HR 98 | Ht 61.0 in | Wt 151.5 lb

## 2020-01-28 DIAGNOSIS — R112 Nausea with vomiting, unspecified: Secondary | ICD-10-CM | POA: Diagnosis not present

## 2020-01-28 DIAGNOSIS — Z794 Long term (current) use of insulin: Secondary | ICD-10-CM

## 2020-01-28 DIAGNOSIS — R142 Eructation: Secondary | ICD-10-CM

## 2020-01-28 DIAGNOSIS — E1140 Type 2 diabetes mellitus with diabetic neuropathy, unspecified: Secondary | ICD-10-CM

## 2020-01-28 DIAGNOSIS — Z800 Family history of malignant neoplasm of digestive organs: Secondary | ICD-10-CM | POA: Diagnosis not present

## 2020-01-28 DIAGNOSIS — R140 Abdominal distension (gaseous): Secondary | ICD-10-CM

## 2020-01-28 DIAGNOSIS — R197 Diarrhea, unspecified: Secondary | ICD-10-CM | POA: Diagnosis not present

## 2020-01-28 MED ORDER — HYOSCYAMINE SULFATE 0.125 MG SL SUBL: 0.1250 mg | SUBLINGUAL_TABLET | SUBLINGUAL | 0 refills | Status: AC | PRN

## 2020-01-28 NOTE — Patient Instructions (Addendum)
If you are age 44 or older, your body mass index should be between 23-30. Your Body mass index is 28.63 kg/m. If this is out of the aforementioned range listed, please consider follow up with your Primary Care Provider.  If you are age 23 or younger, your body mass index should be between 19-25. Your Body mass index is 28.63 kg/m. If this is out of the aformentioned range listed, please consider follow up with your Primary Care Provider.   You have been scheduled for an endoscopy and colonoscopy. Please follow the written instructions given to you at your visit today. Please pick up your prep supplies at the pharmacy within the next 1-3 days. If you use inhalers (even only as needed), please bring them with you on the day of your procedure.  We have sent the following medications to your pharmacy for you to pick up at your convenience: [place] a fiber supplement ( Benefiber, Citrucel)  You have been scheduled for a gastric emptying scan at [facility] on 02/19/20 at 7:30 am. Please arrive at least 15 minutes prior to your appointment for registration. Please make certain not to have anything to eat or drink after midnight the night before your test. Hold all stomach medications (ex: Zofran, phenergan, Reglan) 48 hours prior to your test. If you need to reschedule your appointment, please contact radiology scheduling at [phone]. _____________________________________________________________________ A gastric-emptying study measures how long it takes for food to move through your stomach. There are several ways to measure stomach emptying. In the most common test, you eat food that contains a small amount of radioactive material. A scanner that detects the movement of the radioactive material is placed over your abdomen to monitor the rate at which food leaves your stomach. This test normally takes about 4 hours to  complete. _____________________________________________________________________    It was a pleasure to see you today!  Vito Cirigliano, D.O.

## 2020-01-28 NOTE — Progress Notes (Signed)
Chief Complaint: Anorexia diarrhea, belching, nausea/vomiting  Referring Provider:     Drue Flirt, MD   HPI:    Danielle Warren is a 44 y.o. female with a history of HTN, dyslipidemia, nephrolithiasis, depression, fibromyalgia, chronic pain, diabetes (A1c 12.3%), referred to the Gastroenterology Clinic for evaluation of multiple GI symptoms to include diarrhea, belching, nausea/vomiting, and decreased p.o. intake/anorexia.  She states she will go 4-5 days at a time without tolerating food, belching and n/v. Sxs prsent for a few months.+ Abdominal cramping. Not necessarily early satiety, but stops eating d/t n/v. Has trialed antiemetics, but typically will vomit these up. Improves with antiemetic suppository.  +abdominal bloating and visible abdominal distension. Independent of food types.   Can have intermittent post prandial diarrhea. No hematochezia, but intermittent mucus-like stools.    Had EGD/colonoscopy many years ago either at [facility] or [facility], but cannot recall which and unsure of results.    Recently seen by Dr. Debara Pickett in the Cardiology Clinic for evaluation of tachycardia and chest pain.  Started on metoprolol.  Previous stress testing in 01/2019 was negative.  Was evaluated in the [place] clinic on 09/26/2019 for evaluation of right ovarian hemorrhagic cyst and RLQ pain.  Follows in the Pain Management Clinic.   -09/11/2019: NA 131, BG 370, otherwise normal CMP.  Normal H/H, MCV/RDW 75/14, normal WBC and PLT -09/11/2019: CT abdomen/pelvis: Normal liver, pancreas, spleen. S/p ccy.  Normal GI tract.  5.7 x 5.4 cystic-appearing area in the right hemipelvis.  Subsequent ultrasound confirmed right sided hemorrhagic cyst -11/27/19: Normal WBC, H/H, PLT with MCV 74, NA 131 otherwise CMP, A1c 12.3%, normal TSH -12/05/2019: Negative ANA.  ESR 46, CRP normal  FHx: -Sister recently diagnosed with Colon CA with mets to liver (53) -Father with Prostate CA  then Pancreatic CA (60's) -Brother with Prostate CA  - No prior genetic testing   Past Medical History:  Diagnosis Date  . Arthritis    "lower back; right hip; maybe my neck" (03/20/2014)  . Asthma   . Chronic back pain    "inbeween shoulders going up into my neck; lower back" (03/20/2014)  . Chronic bronchitis ([place])    "get it about q yr" (03/20/2014)  . Degenerative disc disease   . Depression   . Diabetes ([place])   . Fibromyalgia   . Gallstone   . GERD (gastroesophageal reflux disease)   . History of blood transfusion    "after one of my miscarriages"  . Hyperlipemia   . Hypertension   . Kidney disease   . Kidney stones   . Migraine    "had them when I was 7 or 43 yrs old; one day after sleep study on CPAP; one today" (03/20/2014)  . Positive TB test   . Type II diabetes mellitus ([place])      Past Surgical History:  Procedure Laterality Date  . ABDOMINAL HYSTERECTOMY  10/2012  . CENTRAL VENOUS CATHETER INSERTION Left 1999   chest  . CENTRAL VENOUS CATHETER REMOVAL  06/1997  . CERVICAL CERCLAGE  1997; 1999  . CESAREAN SECTION  1999  . COLONOSCOPY     late 1990's   . DILATION AND CURETTAGE OF UTERUS  1995; 1996   "miscarriages"  . ESOPHAGOGASTRODUODENOSCOPY    . LAPAROSCOPIC CHOLECYSTECTOMY  1997  . LITHOTRIPSY  1990's X 1  . TUBAL LIGATION  1999  . WISDOM TOOTH EXTRACTION  1990's?   "all  4 at one time"   Family History  Problem Relation Age of Onset  . Hypertension Mother   . COPD Mother   . Asthma Mother   . Diabetes Father   . Cancer Father        prostate/pancreatic   . Colon cancer Father   . Colon cancer Sister   . Liver cancer Sister   . Lupus Brother   . Asthma Brother   . Asthma Daughter   . Diabetes Sister    Social History   Tobacco Use  . Smoking status: Current Every Day Smoker    Packs/day: 0.50    Years: 17.00    Pack years: 8.50    Types: Cigarettes  . Smokeless tobacco: Never Used  . Tobacco comment: patient is aware she needs to  quit   Vaping Use  . Vaping Use: Never used  Substance Use Topics  . Alcohol use: Yes    Alcohol/week: 0.0 standard drinks    Comment: 03/20/2014 "maybe once/year I'll have a drink"  . Drug use: No   Current Outpatient Medications  Medication Sig Dispense Refill  . albuterol (VENTOLIN HFA) 108 (90 Base) MCG/ACT inhaler Inhale into the lungs.    . AMBULATORY NON FORMULARY MEDICATION as needed. Ibupofen 841m Pepcid 287m   . amLODipine (NORVASC) 10 MG tablet Take 10 mg by mouth daily.     . Marland Kitchentorvastatin (LIPITOR) 40 MG tablet Take 40 mg by mouth at bedtime.    . BD VEO INSULIN SYRINGE U/F 31G X 15/64" 0.5 ML MISC     . cetirizine (ZYRTEC) 10 MG tablet Take 10 mg by mouth daily.    . Continuous Blood Gluc Receiver (FREESTYLE LIBRE 14 DAY READER) DEVI SMARTSIG:1 Topical Every Night    . Continuous Blood Gluc Sensor (FREESTYLE LIBRE 14 DAY SENSOR) MISC SMARTSIG:1 Each Topical Every 2 Weeks    . cyclobenzaprine (FLEXERIL) 10 MG tablet SMARTSIG:1 Tablet(s) By Mouth 1 to 3 Times Daily    . diphenhydrAMINE (BENADRYL) 25 mg capsule Take 1 capsule (25 mg total) by mouth every 6 (six) hours as needed (headache along with compazine). 30 capsule 0  . DULoxetine (CYMBALTA) 30 MG capsule Take 30 mg by mouth 2 (two) times daily.     . Marland KitchenLECTOR 1.3 % PTCH Place 1 patch onto the skin as needed.     . fluconazole (DIFLUCAN) 150 MG tablet Take 150 mg by mouth as needed.     . gabapentin (NEURONTIN) 300 MG capsule Take 2 capsules (600 mg total) by mouth 3 (three) times daily. 90 capsule 0  . glipiZIDE (GLUCOTROL) 10 MG tablet Take 20-30 mg by mouth daily.     . Marland KitchenEVEMIR 100 UNIT/ML injection Inject into the skin. 17units in the morning and 14 units at night    . metoprolol tartrate (LOPRESSOR) 25 MG tablet Take 1 tablet (25 mg total) by mouth 2 (two) times daily. 180 tablet 3  . mirtazapine (REMERON) 15 MG tablet Take 7.5 mg by mouth daily.    . mupirocin ointment (BACTROBAN) 2 % SMARTSIG:Sparingly Topical 3  Times Daily PRN    . ondansetron (ZOFRAN) 4 MG tablet Take 4 mg by mouth as needed.     . Marland KitchenZEMPIC, 0.25 OR 0.5 MG/DOSE, 2 MG/1.5ML SOPN Inject into the skin once a week.     . Marland KitchenHENADOZ 25 MG suppository INSERT 1 SUPPOSITORY RECTALLY EVERY 6 HOURS AS NEEDED FOR 10 DAYS    . tiZANidine (ZANAFLEX) 4 MG tablet Take  by mouth.    . Vitamin D, Ergocalciferol, (DRISDOL) 1.25 MG (50000 UNIT) CAPS capsule Take 50,000 Units by mouth every 7 (seven) days.    . naloxone (NARCAN) 4 MG/0.1ML LIQD nasal spray kit  (Patient not taking: Reported on 01/28/2020)    . oxyCODONE-acetaminophen (PERCOCET) 7.5-325 MG tablet Take 1 tablet by mouth 4 (four) times daily.    . polyethylene glycol powder (GLYCOLAX/MIRALAX) 17 GM/SCOOP powder Take by mouth. (Patient not taking: Reported on 01/28/2020)     No current facility-administered medications for this visit.   Allergies  Allergen Reactions  . Amoxicillin-Pot Clavulanate   . Cephalosporins Other (See Comments)  . Doxycycline Other (See Comments)  . Macrolides And Ketolides Hives  . Nitrofurantoin   . Penicillins Hives  . Sulfa Antibiotics   . Sulfamethoxazole-Trimethoprim Other (See Comments)  . Sulfasalazine Other (See Comments)  . Amoxicillin Rash  . Sulfur Hives  . Telbivudine Hives     Review of Systems: All systems reviewed and negative except where noted in HPI.     Physical Exam:    Wt Readings from Last 3 Encounters:  01/28/20 151 lb 8 oz (68.7 kg)  01/09/20 150 lb 12.8 oz (68.4 kg)  12/05/19 147 lb (66.7 kg)    BP 126/70   Pulse 98   Ht 5' 1"  (1.549 m)   Wt 151 lb 8 oz (68.7 kg)   LMP 02/04/2011   BMI 28.63 kg/m  Constitutional:  Pleasant, in no acute distress. Psychiatric: Normal mood and affect. Behavior is normal. EENT: Pupils normal.  Conjunctivae are normal. No scleral icterus. Neck supple. No cervical LAD. Cardiovascular: Normal rate, regular rhythm. No edema Pulmonary/chest: Effort normal and breath sounds normal. No  wheezing, rales or rhonchi. Abdominal: Soft, nondistended, nontender. Bowel sounds active throughout. There are no masses palpable. No hepatomegaly. Neurological: Alert and oriented to person place and time. Skin: Skin is warm and dry. No rashes noted.   ASSESSMENT AND PLAN;   1) Nausea/Vomiting 2) Anorexia 3) Abdominal bloating/Abdominal distention 4) Belching -Start with EGD to evaluate for mucosal/luminal pathology, with gastric/duodenal biopsies -Gastric Emptying Study -Resume Ensure, boost, etc. as tolerated -Recommended low-fat, low fiber diet.  Can use fiber supplement -Symptoms could certainly also be related to underlying poorly controlled diabetes, chronic opioid use  5) Diarrhea/Change in bowel habits 6) Mucus-like stools -Colonoscopy with random and directed biopsies -Fiber supplement as above -Maintain adequate hydration -Trial course of Levsin 0.125 mg as needed -If above work-up unrevealing and no response to therapy, check pancreatic fecal elastase, GI PCR  7) Family history of Colon cancer 8) Family history of Pancreatic Cancer -Colonoscopy for increased risk CRC screening -Discussed consideration of referral for Genetic testing.  She would like to think about this and discuss on follow-up  9) Diabetes -Most recent hemoglobin A1c centimeters was 12.3%.  Discussed overlap of poorly controlled diabetes and GI symptomatology.  Recently restarted insulin with plan for close follow-up with her PCM.  The indications, risks, and benefits of EGD and colonoscopy were explained to the patient in detail. Risks include but are not limited to bleeding, perforation, adverse reaction to medications, and cardiopulmonary compromise. Sequelae include but are not limited to the possibility of surgery, hositalization, and mortality. The patient verbalized understanding and wished to proceed. All questions answered, referred to scheduler and bowel prep ordered. Further recommendations  pending results of the exam.      Lavena Bullion, DO, FACG  01/28/2020, 2:38 PM   Drue Flirt, MD

## 2020-01-29 ENCOUNTER — Encounter: Payer: Self-pay | Admitting: Neurology

## 2020-01-29 ENCOUNTER — Other Ambulatory Visit: Payer: Self-pay

## 2020-01-29 ENCOUNTER — Ambulatory Visit (INDEPENDENT_AMBULATORY_CARE_PROVIDER_SITE_OTHER): Payer: 59 | Admitting: Neurology

## 2020-01-29 VITALS — BP 126/70 | HR 118 | Ht 61.0 in | Wt 152.0 lb

## 2020-01-29 DIAGNOSIS — Z79891 Long term (current) use of opiate analgesic: Secondary | ICD-10-CM

## 2020-01-29 DIAGNOSIS — G4719 Other hypersomnia: Secondary | ICD-10-CM

## 2020-01-29 DIAGNOSIS — E663 Overweight: Secondary | ICD-10-CM

## 2020-01-29 DIAGNOSIS — G4733 Obstructive sleep apnea (adult) (pediatric): Secondary | ICD-10-CM

## 2020-01-29 DIAGNOSIS — R519 Headache, unspecified: Secondary | ICD-10-CM | POA: Diagnosis not present

## 2020-01-29 NOTE — Progress Notes (Signed)
Subjective:    Patient ID: Danielle Warren is a 44 y.o. female.  HPI     Danielle Age, MD, PhD [facility] [address] [place], [place] [address]  Dear Danielle Warren,   I saw your patient Danielle Warren, upon your kind request in my sleep clinic today for initial consultation of her sleep disorder, in particular, reevaluation of her prior diagnosis of sleep apnea.  The patient is unaccompanied today.  As you know, Danielle Warren is a 44 year old right-handed woman with an underlying medical history of chronic low back pain, followed by pain management, arthritis, diabetes, hypertension, fibromyalgia, headaches, and overweight state, who was diagnosed with obstructive sleep apnea about 3 years ago.  Prior sleep study results are not available for my review today.  She had a CPAP machine but it got lost.  I reviewed your office note from 11/20/2019. She moved to [facility] in July 2021.  She reports that she last used her machine in April 2021 and it got lost when she moved.  She reports benefit from using CPAP.  She recalls that she was diagnosed with mild sleep apnea.  She and her husband live with friends currently and are looking for their own place.  She is currently not working.  Bedtime is generally between 1030 and 11 and rise time around 7.  She does not have night to night nocturia but has had occasional morning headaches.  She would be willing to get retested and consider CPAP therapy again.  She is reducing her caffeine intake, she has practically eliminated her sodas and is reducing her tea intake.  She drinks about 2 servings per day.  She is working on smoking cessation, smokes about half a pack per day currently.  She drinks alcohol only on special occasions, maybe twice a year or so.  Her Epworth sleepiness score is 11 out of 24, fatigue severity score is 54 out of 63.  She reports snoring and witnessed apneas.  She reports tiredness during the day.   Of note, she is on multiple medications including potentially sedating medications.  She is on gabapentin 600 mg 3 times daily.  She no longer takes trazodone.  She takes Remeron 15 mg at bedtime.  She is on oxycodone 4 times a day, she has a prescription for both tizanidine and cyclobenzaprine but prefers to take the cyclobenzaprine.  She takes Cymbalta and occasional Benadryl.  She is on Zyrtec for allergies.  She has a prescription for Zofran and Phenergan.  She reports nausea and difficulty swallowing.  She reports that she is being evaluated for this problem and has an endoscopy pending.  Her Past Medical History Is Significant For: Past Medical History:  Diagnosis Date  . Arthritis    "lower back; right hip; maybe my neck" (03/20/2014)  . Asthma   . Chronic back pain    "inbeween shoulders going up into my neck; lower back" (03/20/2014)  . Chronic bronchitis ([place])    "get it about q yr" (03/20/2014)  . Degenerative disc disease   . Depression   . Fibromyalgia   . Gallstone   . GERD (gastroesophageal reflux disease)   . History of blood transfusion    "after one of my miscarriages"  . Hyperlipemia   . Hypertension   . Kidney stones   . Migraine    "had them when I was 7 or 44 yrs old; one day after sleep study on CPAP; one today" (03/20/2014)  .  Positive TB test   . Type II diabetes mellitus ([place])     Her Past Surgical History Is Significant For: Past Surgical History:  Procedure Laterality Date  . ABDOMINAL HYSTERECTOMY  10/2012  . CENTRAL VENOUS CATHETER INSERTION Left 1999   chest  . CENTRAL VENOUS CATHETER REMOVAL  06/1997  . CERVICAL CERCLAGE  1997; 1999  . CESAREAN SECTION  1999  . COLONOSCOPY     late 1990's   . DILATION AND CURETTAGE OF UTERUS  1995; 1996   "miscarriages"  . ESOPHAGOGASTRODUODENOSCOPY     late 1990's  . LAPAROSCOPIC CHOLECYSTECTOMY  1997  . LITHOTRIPSY  1990's X 1  . TUBAL LIGATION  1999  . WISDOM TOOTH EXTRACTION  1990's?   "all 4 at one  time"    Her Family History Is Significant For: Family History  Problem Relation Warren of Onset  . Hypertension Mother   . COPD Mother   . Asthma Mother   . Diabetes Father   . Cancer Father        prostate/pancreatic   . Colon cancer Father   . Colon cancer Sister   . Liver cancer Sister   . Lupus Brother   . Asthma Brother   . Asthma Daughter   . Diabetes Sister     Her Social History Is Significant For: Social History   Socioeconomic History  . Marital status: Married    Spouse name: Not on file  . Number of children: Not on file  . Years of education: Not on file  . Highest education level: Not on file  Occupational History  . Not on file  Tobacco Use  . Smoking status: Current Every Day Smoker    Packs/day: 0.50    Years: 17.00    Pack years: 8.50    Types: Cigarettes  . Smokeless tobacco: Never Used  . Tobacco comment: patient is aware she needs to quit   Vaping Use  . Vaping Use: Never used  Substance and Sexual Activity  . Alcohol use: Yes    Alcohol/week: 0.0 standard drinks    Comment: 03/20/2014 "maybe once/year I'll have a drink"  . Drug use: No  . Sexual activity: Yes    Partners: Male    Birth control/protection: Surgical  Other Topics Concern  . Not on file  Social History Narrative  . Not on file   Social Determinants of Health   Financial Resource Strain:   . Difficulty of Paying Living Expenses: Not on file  Food Insecurity:   . Worried About Charity fundraiser in the Last Year: Not on file  . Ran Out of Food in the Last Year: Not on file  Transportation Needs:   . Lack of Transportation (Medical): Not on file  . Lack of Transportation (Non-Medical): Not on file  Physical Activity:   . Days of Exercise per Week: Not on file  . Minutes of Exercise per Session: Not on file  Stress:   . Feeling of Stress : Not on file  Social Connections:   . Frequency of Communication with Friends and Family: Not on file  . Frequency of Social  Gatherings with Friends and Family: Not on file  . Attends Religious Services: Not on file  . Active Member of Clubs or Organizations: Not on file  . Attends Archivist Meetings: Not on file  . Marital Status: Not on file    Her Allergies Are:  Allergies  Allergen Reactions  . Amoxicillin-Pot Clavulanate   .  Cephalosporins Other (See Comments)  . Doxycycline Other (See Comments)  . Macrolides And Ketolides Hives  . Nitrofurantoin   . Penicillins Hives  . Sulfa Antibiotics   . Sulfamethoxazole-Trimethoprim Other (See Comments)  . Sulfasalazine Other (See Comments)  . Amoxicillin Rash  . Sulfur Hives  . Telbivudine Hives  :   Her Current Medications Are:  Outpatient Encounter Medications as of 01/29/2020  Medication Sig  . albuterol (VENTOLIN HFA) 108 (90 Base) MCG/ACT inhaler Inhale into the lungs.  Marland Kitchen amLODipine (NORVASC) 10 MG tablet Take 10 mg by mouth daily.   Marland Kitchen atorvastatin (LIPITOR) 40 MG tablet Take 40 mg by mouth at bedtime.  . BD VEO INSULIN SYRINGE U/F 31G X 15/64" 0.5 ML MISC   . cetirizine (ZYRTEC) 10 MG tablet Take 10 mg by mouth daily.  . Continuous Blood Gluc Receiver (FREESTYLE LIBRE 14 DAY READER) DEVI SMARTSIG:1 Topical Every Night  . Continuous Blood Gluc Sensor (FREESTYLE LIBRE 14 DAY SENSOR) MISC Sensor on Left arm  . cyclobenzaprine (FLEXERIL) 10 MG tablet SMARTSIG:1 Tablet(s) By Mouth 1 to 3 Times Daily  . diphenhydrAMINE (BENADRYL) 25 mg capsule Take 1 capsule (25 mg total) by mouth every 6 (six) hours as needed (headache along with compazine).  . DULoxetine (CYMBALTA) 30 MG capsule Take 30 mg by mouth 2 (two) times daily.   Marland Kitchen FLECTOR 1.3 % PTCH Place 1 patch onto the skin as needed.   . fluconazole (DIFLUCAN) 150 MG tablet Take 150 mg by mouth as needed.   . gabapentin (NEURONTIN) 300 MG capsule Take 2 capsules (600 mg total) by mouth 3 (three) times daily.  Marland Kitchen glipiZIDE (GLUCOTROL) 10 MG tablet Take 20-30 mg by mouth daily.   . hyoscyamine  (LEVSIN SL) 0.125 MG SL tablet Place 1 tablet (0.125 mg total) under the tongue every 4 (four) hours as needed.  . Ibuprofen-Famotidine (DUEXIS) 800-26.6 MG TABS Take by mouth as needed.  Marland Kitchen LEVEMIR 100 UNIT/ML injection Inject into the skin. 17units in the morning and 14 units at night  . metoprolol tartrate (LOPRESSOR) 25 MG tablet Take 1 tablet (25 mg total) by mouth 2 (two) times daily.  . mirtazapine (REMERON) 15 MG tablet Take 7.5 mg by mouth daily.  . mupirocin ointment (BACTROBAN) 2 % SMARTSIG:Sparingly Topical 3 Times Daily PRN  . naloxone (NARCAN) 4 MG/0.1ML LIQD nasal spray kit   . ondansetron (ZOFRAN) 4 MG tablet Take 4 mg by mouth as needed.   Marland Kitchen oxyCODONE-acetaminophen (PERCOCET) 7.5-325 MG tablet Take 1 tablet by mouth 4 (four) times daily.  Marland Kitchen OZEMPIC, 0.25 OR 0.5 MG/DOSE, 2 MG/1.5ML SOPN Inject into the skin once a week.   Marland Kitchen PHENADOZ 25 MG suppository INSERT 1 SUPPOSITORY RECTALLY EVERY 6 HOURS AS NEEDED FOR 10 DAYS  . polyethylene glycol powder (GLYCOLAX/MIRALAX) 17 GM/SCOOP powder Take by mouth.   Marland Kitchen tiZANidine (ZANAFLEX) 4 MG tablet Take by mouth.  . Vitamin D, Ergocalciferol, (DRISDOL) 1.25 MG (50000 UNIT) CAPS capsule Take 50,000 Units by mouth every 7 (seven) days.   No facility-administered encounter medications on file as of 01/29/2020.  :  Review of Systems:  Out of a complete 14 point review of systems, all are reviewed and negative with the exception of these symptoms as listed below: Review of Systems  Neurological:       Here for sleep consult. Prior sleep study in 2018 and was started on cpap. Pt reports when she moved to [facility] in July of this year her cpap went missing and  has not been using it since.  Pt would like to reassess. Epworth Sleepiness Scale 0= would never doze 1= slight chance of dozing 2= moderate chance of dozing 3= high chance of dozing  Sitting and reading:2 Watching TV:3 Sitting inactive in a public place (ex. Theater or meeting):2 As a  passenger in a car for an hour without a break:0 Lying down to rest in the afternoon:1 Sitting and talking to someone:1 Sitting quietly after lunch (no alcohol):2 In a car, while stopped in traffic:0 Total:11     Objective:  Neurological Exam  Physical Exam Physical Examination:   Vitals:   01/29/20 1335  BP: 126/70  Pulse: (!) 118  SpO2: 98%   General Examination: The patient is a very pleasant 44 y.o. female in no acute distress. She appears well-developed and well-nourished and well groomed.   HEENT: Normocephalic, atraumatic, pupils are equal, round and reactive to light, extraocular tracking is preserved, face is symmetric, hearing is grossly intact.  Airway examination reveals mild mouth dryness, adequate dental hygiene, she has a partial plate on top.  She has mild airway crowding secondary to small airway entry, tonsils are small, Mallampati class II, tongue protrudes centrally and palate elevates symmetrically.  Neck circumference is 14-5/8 inches.   Chest: Clear to auscultation without wheezing, rhonchi or crackles noted.  Heart: S1+S2+0, regular and normal without murmurs, rubs or gallops noted.   Abdomen: Soft, non-tender and non-distended with normal bowel sounds appreciated on auscultation.  Extremities: There is no pitting edema in the distal lower extremities bilaterally. Pedal pulses are intact.  Skin: Warm and dry without trophic changes noted. There are no varicose veins.  Musculoskeletal: exam reveals no obvious joint deformities, tenderness or joint swelling or erythema.  But she does report low back pain, she holds her lower back with her hands or supports it while walking and stands up with her lower back bent.  She also reports pain in her knees.  Neurologically:  Mental status: The patient is awake, alert and oriented in all 4 spheres. Her immediate and remote memory, attention, language skills and fund of knowledge are appropriate. There is no evidence  of aphasia, agnosia, apraxia or anomia. Speech is clear with normal prosody and enunciation. Thought process is linear. Mood is normal and affect is normal.  Cranial nerves II - XII are as described above under HEENT exam. In addition: shoulder shrug is normal with equal shoulder height noted. Motor exam: Normal bulk, strength and tone is noted. There is no tremor. Fine motor skills and coordination: Grossly intact in the upper and lower extremities.   Cerebellar testing: No dysmetria or intention tremor. There is no truncal or gait ataxia.  Sensory exam: intact to light touch.  Gait, station and balance: She stands up slowly.  She walks slowly, she has a mild limp on the right.  She turns slowly.    Assessment and Plan:   In summary, Elinda Bunten is a very pleasant 44 y.o.-year old female with an underlying medical history of chronic low back pain, followed by pain management, arthritis, diabetes, hypertension, fibromyalgia, headaches, and overweight state, who presents for evaluation of her sleep disorder.  She was previously diagnosed with obstructive sleep apnea and placed on either a CPAP or AutoPap therapy.  Compliance download is not available and prior sleep study results are not available.  She reports benefit from CPAP therapy.  She is advised to proceed with sleep study testing to reevaluate her sleep apnea.  She is  agreeable.  She would be willing to continue with positive airway pressure treatment.  We will await test results and keep her posted by phone call and arrange for follow-up after testing.   I answered all her questions today and she was in agreement. Thank you very much for allowing me to participate in the care of this nice patient. If I can be of any further assistance to you please do not hesitate to call me at [phone].  Sincerely,   Danielle Age, MD, PhD

## 2020-01-29 NOTE — Patient Instructions (Signed)
  Based on your symptoms and your exam I believe you are still at risk for obstructive sleep apnea and would benefit from reevaluation as it has been about 3 years and you currently no longer have a CPAP machine.    We will consider CPAP therapy or AutoPap therapy based on your test results.  We will check with your insurance about authorization of your sleep study.  We will call you to schedule this.    Please remember, the risks and ramifications of moderate to severe obstructive sleep apnea or OSA are: Cardiovascular disease, including congestive heart failure, stroke, difficult to control hypertension, arrhythmias, and even type 2 diabetes has been linked to untreated OSA. Sleep apnea causes disruption of sleep and sleep deprivation in most cases, which, in turn, can cause recurrent headaches, problems with memory, mood, concentration, focus, and vigilance. Most people with untreated sleep apnea report excessive daytime sleepiness, which can affect their ability to drive. Please do not drive if you feel sleepy.

## 2020-02-06 ENCOUNTER — Ambulatory Visit (AMBULATORY_SURGERY_CENTER): Payer: 59 | Admitting: Gastroenterology

## 2020-02-06 ENCOUNTER — Other Ambulatory Visit: Payer: Self-pay

## 2020-02-06 ENCOUNTER — Encounter: Payer: Self-pay | Admitting: Gastroenterology

## 2020-02-06 VITALS — BP 98/56 | HR 86 | Resp 15 | Ht 61.0 in | Wt 151.0 lb

## 2020-02-06 DIAGNOSIS — R140 Abdominal distension (gaseous): Secondary | ICD-10-CM | POA: Diagnosis not present

## 2020-02-06 DIAGNOSIS — Z800 Family history of malignant neoplasm of digestive organs: Secondary | ICD-10-CM

## 2020-02-06 DIAGNOSIS — K648 Other hemorrhoids: Secondary | ICD-10-CM | POA: Diagnosis not present

## 2020-02-06 DIAGNOSIS — D124 Benign neoplasm of descending colon: Secondary | ICD-10-CM

## 2020-02-06 DIAGNOSIS — K640 First degree hemorrhoids: Secondary | ICD-10-CM

## 2020-02-06 DIAGNOSIS — R197 Diarrhea, unspecified: Secondary | ICD-10-CM

## 2020-02-06 DIAGNOSIS — K319 Disease of stomach and duodenum, unspecified: Secondary | ICD-10-CM | POA: Diagnosis not present

## 2020-02-06 DIAGNOSIS — K635 Polyp of colon: Secondary | ICD-10-CM | POA: Diagnosis not present

## 2020-02-06 DIAGNOSIS — K3189 Other diseases of stomach and duodenum: Secondary | ICD-10-CM | POA: Diagnosis not present

## 2020-02-06 DIAGNOSIS — R739 Hyperglycemia, unspecified: Secondary | ICD-10-CM | POA: Diagnosis not present

## 2020-02-06 DIAGNOSIS — R112 Nausea with vomiting, unspecified: Secondary | ICD-10-CM | POA: Diagnosis not present

## 2020-02-06 DIAGNOSIS — R142 Eructation: Secondary | ICD-10-CM

## 2020-02-06 DIAGNOSIS — K449 Diaphragmatic hernia without obstruction or gangrene: Secondary | ICD-10-CM

## 2020-02-06 MED ORDER — INSULIN REGULAR HUMAN 100 UNIT/ML IJ SOLN
4.0000 [IU] | Freq: Once | INTRAMUSCULAR | Status: AC
  Administered 2020-02-06: 4 [IU] via INTRAVENOUS

## 2020-02-06 MED ORDER — SODIUM CHLORIDE 0.9 % IV SOLN: 500.0000 mL | INTRAVENOUS | Status: DC

## 2020-02-06 NOTE — Progress Notes (Signed)
To PACU, VSS. Report to [email or web address] 

## 2020-02-06 NOTE — Patient Instructions (Signed)
Handouts given for Hiatal Hernia, Hemorrhoids and Polyps.  YOU HAD AN ENDOSCOPIC PROCEDURE TODAY AT [facility]:   Refer to the procedure report that was given to you for any specific questions about what was found during the examination.  If the procedure report does not answer your questions, please call your gastroenterologist to clarify.  If you requested that your care partner not be given the details of your procedure findings, then the procedure report has been included in a sealed envelope for you to review at your convenience later.  YOU SHOULD EXPECT: Some feelings of bloating in the abdomen. Passage of more gas than usual.  Walking can help get rid of the air that was put into your GI tract during the procedure and reduce the bloating. If you had a lower endoscopy (such as a colonoscopy or flexible sigmoidoscopy) you may notice spotting of blood in your stool or on the toilet paper. If you underwent a bowel prep for your procedure, you may not have a normal bowel movement for a few days.  Please Note:  You might notice some irritation and congestion in your nose or some drainage.  This is from the oxygen used during your procedure.  There is no need for concern and it should clear up in a day or so.  SYMPTOMS TO REPORT IMMEDIATELY:   Following lower endoscopy (colonoscopy or flexible sigmoidoscopy):  Excessive amounts of blood in the stool  Significant tenderness or worsening of abdominal pains  Swelling of the abdomen that is new, acute  Fever of 100F or higher   Following upper endoscopy (EGD)  Vomiting of blood or coffee ground material  New chest pain or pain under the shoulder blades  Painful or persistently difficult swallowing  New shortness of breath  Fever of 100F or higher  Black, tarry-looking stools  For urgent or emergent issues, a gastroenterologist can be reached at any hour by calling [phone]. Do not use MyChart messaging for urgent  concerns.    DIET:  We do recommend a small meal at first, but then you may proceed to your regular diet.  Drink plenty of fluids but you should avoid alcoholic beverages for 24 hours.  ACTIVITY:  You should plan to take it easy for the rest of today and you should NOT DRIVE or use heavy machinery until tomorrow (because of the sedation medicines used during the test).    FOLLOW UP: Our staff will call the number listed on your records 48-72 hours following your procedure to check on you and address any questions or concerns that you may have regarding the information given to you following your procedure. If we do not reach you, we will leave a message.  We will attempt to reach you two times.  During this call, we will ask if you have developed any symptoms of COVID 19. If you develop any symptoms (ie: fever, flu-like symptoms, shortness of breath, cough etc.) before then, please call [phone].  If you test positive for Covid 19 in the 2 weeks post procedure, please call and report this information to [place].    If any biopsies were taken you will be contacted by phone or by letter within the next 1-3 weeks.  Please call us at [phone] if you have not heard about the biopsies in 3 weeks.    SIGNATURES/CONFIDENTIALITY: You and/or your care partner have signed paperwork which will be entered into your electronic medical record.  These signatures attest  to the fact that that the information above on your After Visit Summary has been reviewed and is understood.  Full responsibility of the confidentiality of this discharge information lies with you and/or your care-partner. 

## 2020-02-06 NOTE — Op Note (Signed)
[place] Patient Name: Danielle Warren Procedure Date: 02/06/2020 2:44 PM MRN: [id number] Endoscopist: Gerrit Heck , MD Age: 44 Referring MD:  Date of Birth: [birth year] Gender: Female Account #: [id number] Procedure:                Colonoscopy Indications:              Screening in patient at increased risk: Colorectal                            cancer in sister before age 57                           Additionally, she has had recent change in bowel                            habits, Diarrhea, and abdominal bloating and                            cramping. Medicines:                Monitored Anesthesia Care Procedure:                Pre-Anesthesia Assessment:                           - Prior to the procedure, a History and Physical                            was performed, and patient medications and                            allergies were reviewed. The patient's tolerance of                            previous anesthesia was also reviewed. The risks                            and benefits of the procedure and the sedation                            options and risks were discussed with the patient.                            All questions were answered, and informed consent                            was obtained. Prior Anticoagulants: The patient has                            taken no previous anticoagulant or antiplatelet                            agents. ASA Grade Assessment: III - A patient with  severe systemic disease. After reviewing the risks                            and benefits, the patient was deemed in                            satisfactory condition to undergo the procedure.                           After obtaining informed consent, the colonoscope                            was passed under direct vision. Throughout the                            procedure, the patient's blood pressure, pulse, and                             oxygen saturations were monitored continuously. The                            Colonoscope was introduced through the anus and                            advanced to the the cecum, identified by                            appendiceal orifice and ileocecal valve. The                            colonoscopy was performed without difficulty. The                            patient tolerated the procedure well. The quality                            of the bowel preparation was good. The ileocecal                            valve, appendiceal orifice, and rectum were                            photographed. Scope In: 3:03:12 PM Scope Out: 3:24:47 PM Scope Withdrawal Time: 0 hours 17 minutes 39 seconds  Total Procedure Duration: 0 hours 21 minutes 35 seconds  Findings:                 The perianal and digital rectal examinations were                            normal.                           A 8 mm polyp was found in the descending colon. The  polyp was sessile. The polyp was removed with a                            cold snare. Resection and retrieval were complete.                            Estimated blood loss was minimal.                           The visualized mucosa was otherwise normal                            appearing throughout the colon. Biopsies for                            histology were taken with a cold forceps from the                            right colon and left colon for evaluation of                            microscopic colitis. Estimated blood loss was                            minimal.                           Retroflexion in the rectum was not performed due to                            anatomy (narrowed rectal vault).                           Non-bleeding internal hemorrhoids were found during                            anoscopy. The hemorrhoids were small. Complications:            No immediate complications. Estimated  Blood Loss:     Estimated blood loss was minimal. Impression:               - One 8 mm polyp in the descending colon, removed                            with a cold snare. Resected and retrieved.                           - Normal mucosa in the entire examined colon.                            Biopsied.                           - Non-bleeding internal hemorrhoids. Recommendation:           - Patient has a contact number available for  emergencies. The signs and symptoms of potential                            delayed complications were discussed with the                            patient. Return to normal activities tomorrow.                            Written discharge instructions were provided to the                            patient.                           - Resume previous diet.                           - Continue present medications.                           - Await pathology results.                           - Repeat colonoscopy in 5 years for surveillance                            based on pathology results and due to family                            history. Gerrit Heck, MD 02/06/2020 3:36:15 PM

## 2020-02-06 NOTE — Progress Notes (Signed)
Walked pt to the BR, pt states she is weak, states she is nauseaus and dizzy.  Talked to pt about her high blood sugar and the way that can cause these symptoms she has been having of nausea and diarrhea.  Call to pt husband Hedy Camara to come up and help pt get dressed.  Pt is feeling bloating and states she cannot pass any more gas, levsin given at 1605.  Hedy Camara is here, gave him instructions as well as pt and he is helping her get dressed. Pt discharged, feeling better, no c/o voiced.

## 2020-02-06 NOTE — Op Note (Signed)
[place] Patient Name: Danielle Warren Procedure Date: 02/06/2020 2:45 PM MRN: [id number] Endoscopist: Gerrit Heck , MD Age: 44 Referring MD:  Date of Birth: 02-18-1976 Gender: Female Account #: [id number] Procedure:                Upper GI endoscopy Indications:              Anorexia, Abdominal bloating, Diarrhea, Eructation,                            Nausea with vomiting Medicines:                Monitored Anesthesia Care Procedure:                Pre-Anesthesia Assessment:                           - Prior to the procedure, a History and Physical                            was performed, and patient medications and                            allergies were reviewed. The patient's tolerance of                            previous anesthesia was also reviewed. The risks                            and benefits of the procedure and the sedation                            options and risks were discussed with the patient.                            All questions were answered, and informed consent                            was obtained. Prior Anticoagulants: The patient has                            taken no previous anticoagulant or antiplatelet                            agents. ASA Grade Assessment: III - A patient with                            severe systemic disease. After reviewing the risks                            and benefits, the patient was deemed in                            satisfactory condition to undergo the procedure.  After obtaining informed consent, the endoscope was                            passed under direct vision. Throughout the                            procedure, the patient's blood pressure, pulse, and                            oxygen saturations were monitored continuously. The                            Endoscope was introduced through the mouth, and                            advanced to the second part  of duodenum. The upper                            GI endoscopy was accomplished without difficulty.                            The patient tolerated the procedure well. Scope In: Scope Out: Findings:                 The examined esophagus was normal.                           A 2 cm hiatal hernia was present.                           Retained fluid and food was found in the gastric                            fundus and in the gastric body. This was removed                            with fluid aspiration via endoscope.                           Normal mucosa was found in the entire examined                            stomach. Biopsies were taken with a cold forceps                            for Helicobacter pylori testing. Estimated blood                            loss was minimal. The pylorus was patent.                           The examined duodenum was normal. Biopsies for  histology were taken with a cold forceps for                            evaluation of celiac disease. Estimated blood loss                            was minimal. Complications:            No immediate complications. Estimated Blood Loss:     Estimated blood loss was minimal. Impression:               - Normal esophagus.                           - 2 cm hiatal hernia.                           - Retained gastric fluid. This was removed.                           - Normal mucosa was found in the entire stomach.                            Biopsied.                           - Normal examined duodenum. Biopsied. Recommendation:           - Patient has a contact number available for                            emergencies. The signs and symptoms of potential                            delayed complications were discussed with the                            patient. Return to normal activities tomorrow.                            Written discharge instructions were provided to the                             patient.                           - Resume previous diet.                           - Continue present medications.                           - Perform a colonoscopy today.                           - Proceed with gastric emptying study as previously  scheduled.                           - Follow-up in the GI clinic after additional                            work-up is complete. Gerrit Heck, MD 02/06/2020 3:31:10 PM

## 2020-02-06 NOTE — Progress Notes (Signed)
Pt's states no medical or surgical changes since previsit or office visit. 

## 2020-02-06 NOTE — Progress Notes (Signed)
Called to room to assist during endoscopic procedure.  Patient ID and intended procedure confirmed with present staff. Received instructions for my participation in the procedure from the performing physician.  

## 2020-02-10 ENCOUNTER — Telehealth: Payer: Self-pay

## 2020-02-10 ENCOUNTER — Telehealth: Payer: Self-pay | Admitting: *Deleted

## 2020-02-10 MED ORDER — OMEPRAZOLE 20 MG PO CPDR: 20.0000 mg | DELAYED_RELEASE_CAPSULE | Freq: Two times a day (BID) | ORAL | 3 refills | Status: AC

## 2020-02-10 NOTE — Telephone Encounter (Signed)
First attempt follow up call to pt, lm on vm 

## 2020-02-10 NOTE — Telephone Encounter (Signed)
  Follow up Call-  Call back number 02/06/2020  Post procedure Call Back phone  # [phone]  Permission to leave phone message Yes  Some recent data might be hidden     Patient questions:  Do you have a fever, pain , or abdominal swelling? Yes.   Pain Score  3 *  Have you tolerated food without any problems? No.  Have you been able to return to your normal activities? Yes.    Do you have any questions about your discharge instructions: Diet   No. Medications  Yes.   Follow up visit  Yes.    Do you have questions or concerns about your Care? Yes.      PT has continued to have nausea with vomiting almost daily and continues with the abdominal pain. She is experiencing "heart burn" and is asking for prescription to help with this. Will let Dr. Bryan Lemma know.  Actions: * If pain score is 4 or above: No action needed, pain <4.  1. Have you developed a fever since your procedure? no  2.   Have you had an respiratory symptoms (SOB or cough) since your procedure? no  3.   Have you tested positive for COVID 19 since your procedure no  4.   Have you had any family members/close contacts diagnosed with the COVID 19 since your procedure?  no   If yes to any of these questions please route to Joylene John, RN and Joella Prince, RN

## 2020-02-10 NOTE — Telephone Encounter (Signed)
Biopsies still pending. Ok to send in Rx for omeprazole 20 mg PO BID x4 weeks for diagnostic and therapeutic intent for UGI sxs. After 4 weeks, reduce to 20 mg/day. To take 30-60 mins prior to meal. Should still proceed with gastric Emptying Study as scheduled and can f/u in GI clinic afterwards to review results. Thanks.

## 2020-02-10 NOTE — Telephone Encounter (Signed)
Rx sent to her pharmacy. Pt will f/u in the office after the Gastric emptying study.

## 2020-02-12 ENCOUNTER — Other Ambulatory Visit: Payer: Self-pay

## 2020-02-12 ENCOUNTER — Ambulatory Visit (INDEPENDENT_AMBULATORY_CARE_PROVIDER_SITE_OTHER): Payer: 59 | Admitting: Neurology

## 2020-02-12 DIAGNOSIS — E663 Overweight: Secondary | ICD-10-CM

## 2020-02-12 DIAGNOSIS — G4733 Obstructive sleep apnea (adult) (pediatric): Secondary | ICD-10-CM | POA: Diagnosis not present

## 2020-02-12 DIAGNOSIS — Z79891 Long term (current) use of opiate analgesic: Secondary | ICD-10-CM

## 2020-02-12 DIAGNOSIS — G4719 Other hypersomnia: Secondary | ICD-10-CM

## 2020-02-12 DIAGNOSIS — R519 Headache, unspecified: Secondary | ICD-10-CM

## 2020-02-14 NOTE — Procedures (Signed)
PATIENT'S NAME:  Danielle Warren, Danielle Warren DOB:      07-Feb-1976      MR#:    [id number]     DATE OF RECORDING: 02/12/2020 REFERRING M.D.:  Glean Salvo, NP Study Performed:   Baseline Polysomnogram HISTORY: 44 year old woman with a history of chronic low back pain, followed by pain management, arthritis, diabetes, hypertension, fibromyalgia, headaches, and overweight state, who was diagnosed with obstructive sleep apnea about 3 years ago. Her machine got lost in the process of her recent move. The patient endorsed the Epworth Sleepiness Scale at 11 points. The patient's weight 152 pounds with a height of 61 (inches), resulting in a BMI of 28.7 kg/m2. The patient's neck circumference measured 14.6 inches.  CURRENT MEDICATIONS: Ventolin, Norvasc, Lipitor, Zyrtec, Flexeril, Benadryl, Cymbalta, Flector patch, Diflucan, Neurontin, Glucotrol, Levsin SL, Duexis, Levemir, Lopressor, Remeron, Narcan, Zofran, Percocet, Ozempic, Phenadoz, Zanaflex, Vit D   PROCEDURE:  This is a multichannel digital polysomnogram utilizing the Somnostar 11.2 system.  Electrodes and sensors were applied and monitored per AASM Specifications.   EEG, EOG, Chin and Limb EMG, were sampled at 200 Hz.  ECG, Snore and Nasal Pressure, Thermal Airflow, Respiratory Effort, CPAP Flow and Pressure, Oximetry was sampled at 50 Hz. Digital video and audio were recorded.      BASELINE STUDY  Lights Out was at 22:57 and Lights On at 04:54.  Total recording time (TRT) was 357.5 minutes, with a total sleep time (TST) of 316.5 minutes. The patient's sleep latency was 33 minutes, which is delayed. REM latency was 105 minutes.  The sleep efficiency was 88.5 %.     SLEEP ARCHITECTURE: WASO (Wake after sleep onset) was 13 minutes with minimal to mild sleep fragmentation noted. There were 12.5 minutes in Stage N1, 190.5 minutes Stage N2, 62 minutes Stage N3 and 51.5 minutes in Stage REM.  The percentage of Stage N1 was 3.9%, Stage N2 was 60.2%, which is mildly  increased, Stage N3 was 19.6%, which is normal, and Stage R (REM sleep) was 16.3%, which is mildly reduced. The arousals were noted as: 15 were spontaneous, 0 were associated with PLMs, 26 were associated with respiratory events.  RESPIRATORY ANALYSIS:  There were a total of 124 respiratory events:  59 obstructive apneas, 0 central apneas and 0 mixed apneas with a total of 59 apneas and an apnea index (AI) of 11.2 /hour. There were 65 hypopneas with a hypopnea index of 12.3 /hour. The patient also had 0 respiratory event related arousals (RERAs).      The total APNEA/HYPOPNEA INDEX (AHI) was 23.5/hour and the total RESPIRATORY DISTURBANCE INDEX was  23.5 /hour.  79 events occurred in REM sleep and 80 events in NREM. The REM AHI was  92. /hour, versus a non-REM AHI of 10.2. The patient spent 281 minutes of total sleep time in the supine position and 36 minutes in non-supine.. The supine AHI was 25.8 versus a non-supine AHI of 5.1.  OXYGEN SATURATION & C02:  The Wake baseline 02 saturation was 95%, with the lowest being 73%. Time spent below 89% saturation equaled 17 minutes.   PERIODIC LIMB MOVEMENTS: The patient had a total of 0 Periodic Limb Movements.  The Periodic Limb Movement (PLM) index was 0 and the PLM Arousal index was 0/hour.   Audio and video analysis did not show any abnormal or unusual movements, behaviors, phonations or vocalizations. The patient took no bathroom breaks. Mild to moderate snoring was noted. The EKG was in keeping with normal sinus rhythm (NSR).  Post-study, the patient indicated that sleep was better than usual.   IMPRESSION:  1. Obstructive Sleep Apnea (OSA)  RECOMMENDATIONS:  1. This study demonstrates moderate to severe obstructive sleep apnea, with a total AHI of 23.5/hour, REM AHI of 92/hour, supine AHI of 25.8/hour and O2 nadir of 73%. Treatment with positive airway pressure in the form of CPAP is recommended. This will require a full night titration study to  optimize therapy and monitor proper O2 saturations. Other treatment options may be limited due to the severity of her sleep apnea, but - generally speaking - include avoidance of supine sleep position along with weight loss, upper airway or jaw surgery in selected patients or the use of an oral appliance in certain patients. ENT evaluation and/or consultation with a maxillofacial surgeon or dentist may be feasible in some instances.    2. Please note that untreated obstructive sleep apnea may carry additional perioperative morbidity. Patients with significant obstructive sleep apnea should receive perioperative PAP therapy and the surgeons and particularly the anesthesiologist should be informed of the diagnosis and the severity of the sleep disordered breathing. 3. The patient should be cautioned not to drive, work at heights, or operate dangerous or heavy equipment when tired or sleepy. Review and reiteration of good sleep hygiene measures should be pursued with any patient. 4. The patient will be seen in follow-up in the sleep clinic at [facility] for discussion of the test results, symptom and treatment compliance review, further management strategies, etc. The referring provider will be notified of the test results.  I certify that I have reviewed the entire raw data recording prior to the issuance of this report in accordance with the Standards of Accreditation of the American Academy of Sleep Medicine (AASM)  Star Age, MD, PhD Diplomat, American Board of Neurology and Sleep Medicine (Neurology and Sleep Medicine)

## 2020-02-14 NOTE — Addendum Note (Signed)
Addended by: Star Age on: 02/14/2020 01:05 PM   Modules accepted: Orders

## 2020-02-14 NOTE — Progress Notes (Signed)
Patient referred by Glean Salvo, NP, seen by me on 01/29/20, diagnostic PSG on 02/12/20.   Please call and notify the patient that the recent sleep study showed moderate to severe obstructive sleep apnea. I recommend she return for a repeat sleep study for proper titration and mask fitting and correct monitoring of the oxygen saturations, as she did have severe desaturations during REM sleep. Please explain to patient. I have placed an order in the chart. Thanks.  Star Age, MD, PhD [facility])

## 2020-02-17 ENCOUNTER — Telehealth: Payer: Self-pay

## 2020-02-17 ENCOUNTER — Encounter: Payer: Self-pay | Admitting: Gastroenterology

## 2020-02-17 NOTE — Telephone Encounter (Signed)
-----   Message from Star Age, MD sent at 02/14/2020  1:05 PM EDT ----- Patient referred by Glean Salvo, NP, seen by me on 01/29/20, diagnostic PSG on 02/12/20.    Please call and notify the patient that the recent sleep study showed moderate to severe obstructive sleep apnea. I recommend she return for a repeat sleep study for proper titration and mask fitting and correct monitoring of the oxygen saturations, as she did have severe desaturations during REM sleep. Please explain to patient. I have placed an order in the chart. Thanks.  Star Age, MD, PhD [facility])

## 2020-02-17 NOTE — Telephone Encounter (Signed)
Pt returned my call and we discussed sleep study result. I advised pt that Dr. Rexene Alberts reviewed their sleep study results and found that severe osa was present and recommends that pt be treated with a cpap. Dr. Rexene Alberts recommends that pt return for a repeat sleep study in order to properly titrate the cpap and ensure a good mask fit. Pt is agreeable to returning for a titration study. I advised pt that our sleep lab will file with pt's insurance and call pt to schedule the sleep study when we hear back from the pt's insurance regarding coverage of this sleep study. Pt verbalized understanding of results. Pt had no questions at this time but was encouraged to call back if questions arise.  Pt did state her mobile # is not working at present. She can be best reached via her daughter. She provided two #'s [phone] and [phone].

## 2020-02-17 NOTE — Telephone Encounter (Signed)
I called the pt and was connected with her mom. I left a message with the mom asking pt to call me back.

## 2020-02-19 ENCOUNTER — Other Ambulatory Visit: Payer: Self-pay

## 2020-02-19 ENCOUNTER — Encounter (HOSPITAL_COMMUNITY)
Admission: RE | Admit: 2020-02-19 | Discharge: 2020-02-19 | Disposition: A | Discharge status: Home / Self Care | Payer: 59 | Source: Ambulatory Visit | Attending: Gastroenterology | Admitting: Gastroenterology

## 2020-02-19 DIAGNOSIS — R112 Nausea with vomiting, unspecified: Secondary | ICD-10-CM | POA: Insufficient documentation

## 2020-02-19 MED ORDER — TECHNETIUM TC 99M SULFUR COLLOID
1.9000 | Freq: Once | INTRAVENOUS | Status: AC | PRN
  Administered 2020-02-19: 1.9 via INTRAVENOUS

## 2020-02-20 ENCOUNTER — Telehealth: Payer: Self-pay | Admitting: Family Medicine

## 2020-02-20 DIAGNOSIS — M5416 Radiculopathy, lumbar region: Secondary | ICD-10-CM

## 2020-02-20 DIAGNOSIS — M5412 Radiculopathy, cervical region: Secondary | ICD-10-CM

## 2020-02-20 NOTE — Telephone Encounter (Signed)
Called pt to clarify she attended Neurologist referred to by Dr.Schmitz (per pt on Neuro appt day she was sick & tried to cancel appt w/them but their office has a more than 24hr notice requirement (which she didn't meet) so they will not see her/reschedule til Feb2022).   --Forwarding message to Dr. Raeford Razor to see if new referral to [facility] provider considerable?  --glh

## 2020-02-20 NOTE — Telephone Encounter (Signed)
Referral placed to neurosurgery.   Rosemarie Ax, MD [facility] 02/20/2020, 1:36 PM

## 2020-02-28 ENCOUNTER — Ambulatory Visit: Payer: 59 | Admitting: Orthotics

## 2020-03-02 ENCOUNTER — Ambulatory Visit (INDEPENDENT_AMBULATORY_CARE_PROVIDER_SITE_OTHER): Payer: 59 | Admitting: Neurology

## 2020-03-02 DIAGNOSIS — G4733 Obstructive sleep apnea (adult) (pediatric): Secondary | ICD-10-CM | POA: Diagnosis not present

## 2020-03-02 DIAGNOSIS — E663 Overweight: Secondary | ICD-10-CM

## 2020-03-02 DIAGNOSIS — G4719 Other hypersomnia: Secondary | ICD-10-CM

## 2020-03-02 DIAGNOSIS — Z79891 Long term (current) use of opiate analgesic: Secondary | ICD-10-CM

## 2020-03-02 DIAGNOSIS — R519 Headache, unspecified: Secondary | ICD-10-CM

## 2020-03-02 DIAGNOSIS — G4720 Circadian rhythm sleep disorder, unspecified type: Secondary | ICD-10-CM

## 2020-03-11 ENCOUNTER — Telehealth: Payer: Self-pay | Admitting: Family Medicine

## 2020-03-11 DIAGNOSIS — M5412 Radiculopathy, cervical region: Secondary | ICD-10-CM

## 2020-03-11 DIAGNOSIS — M5416 Radiculopathy, lumbar region: Secondary | ICD-10-CM

## 2020-03-11 NOTE — Telephone Encounter (Signed)
Patient called states went to Neuro Surg appt but cannot return for care as [facility] neurosurgery is Out of Network with her Ins C[employer])  --Forwarding message to provider ( New Referral needs to be entered in chart.  -----  [facility] & or [facility] (in ntwk w/[employer]) -- Dion Body

## 2020-03-13 NOTE — Addendum Note (Signed)
Addended by: Star Age on: 03/13/2020 12:18 PM   Modules accepted: Orders

## 2020-03-13 NOTE — Procedures (Signed)
PATIENT'S NAME:  Danielle Warren, Danielle Warren DOB:      [birth year]      MR#:    [id number]     DATE OF RECORDING: 03/02/2020 REFERRING M.D.:  Glean Salvo, NP Study Performed:   CPAP  Titration HISTORY: 44 year old woman with a history of chronic low back pain, followed by pain management, arthritis, diabetes, hypertension, fibromyalgia, headaches, and overweight state, who presents for a full night titration study. Her baseline sleep study from 02/12/20 showed moderate to severe obstructive sleep apnea, with a total AHI of 23.5/hour, REM AHI of 92/hour, supine AHI of 25.8/hour and O2 nadir of 73%. The patient endorsed the Epworth Sleepiness Scale at 11/24 points. The patient's weight 152 pounds with a height of 61 (inches), resulting in a BMI of 28.7 kg/m2. The patient's neck circumference measured 14.6 inches.  CURRENT MEDICATIONS: Ventolin, Norvasc, Lipitor, Zyrtec, Flexeril, Benadryl, Cymbalta, Flector patch, Diflucan, Neurontin, Glucotrol, Levsin SL, Duexis, Levemir, Lopressor, Remeron, Narcan, Zofran, Percocet, Ozempic, Phenadoz, Zanaflex, Vit D  PROCEDURE:  This is a multichannel digital polysomnogram utilizing the SomnoStar 11.2 system.  Electrodes and sensors were applied and monitored per AASM Specifications.   EEG, EOG, Chin and Limb EMG, were sampled at 200 Hz.  ECG, Snore and Nasal Pressure, Thermal Airflow, Respiratory Effort, CPAP Flow and Pressure, Oximetry was sampled at 50 Hz. Digital video and audio were recorded.      The patient was fitted with a medium Dreamwear FFM. CPAP was initiated at 5 cmH20 with heated humidity per AASM split night standards and pressure was advanced to 10 cmH20 because of hypopneas, apneas and desaturations. At a PAP pressure of 10 cm H20, there was a reduction of the AHI to 0/hour, with non-supine NREM sleep achieved and O2 nadir of 94%.   Lights Out was at 22:31 and Lights On at 04:59. Total recording time (TRT) was 388.5 minutes, with a total sleep time (TST) of  304 minutes. The patient's sleep latency was 58 minutes, which is delayed. REM latency was 114.5 minutes.  The sleep efficiency was 78.2 %.    SLEEP ARCHITECTURE: WASO (Wake after sleep onset) was 37 minutes with mild sleep fragmentation noted. There were 9.5 minutes in Stage N1, 149.5 minutes Stage N2, 136 minutes Stage N3 and 9 minutes in Stage REM.  The percentage of Stage N1 was 3.1%, Stage N2 was 49.2%, Stage N3 was 44.7% and Stage R (REM sleep) was 3.%, which is markedly reduced. The arousals were noted as: 57 were spontaneous, 0 were associated with PLMs, 2 were associated with respiratory events.  RESPIRATORY ANALYSIS:  There was a total of 9 respiratory events: 4 obstructive apneas, 1 central apneas and 0 mixed apneas with a total of 5 apneas and an apnea index (AHI) of 1. /hour. There were 4 hypopneas with a hypopnea index of .8/hour. The patient also had 0 respiratory event related arousals (RERAs).      The total APNEA/HYPOPNEA INDEX  (AHI) was 1.8 /hour and the total RESPIRATORY DISTURBANCE INDEX was 1.8 /hour  0 events occurred in REM sleep and 9 events in NREM. The REM AHI was 0 /hour versus a non-REM AHI of 1.8 /hour.  The patient spent 102 minutes of total sleep time in the supine position and 202 minutes in non-supine. The supine AHI was 4.2, versus a non-supine AHI of 0.6.  OXYGEN SATURATION & C02:  The baseline 02 saturation was 94%, with the lowest being 93%. Time spent below 89% saturation equaled 0 minutes.  PERIODIC LIMB  MOVEMENTS:  The patient had a total of 0 Periodic Limb Movements. The Periodic Limb Movement (PLM) index was 0 and the PLM Arousal index was 0 /hour.  Audio and video analysis did not show any abnormal or unusual movements, behaviors, phonations or vocalizations. The patient took 1 bathroom break. The EKG was in keeping with normal sinus rhythm.  Post-study, the patient indicated that sleep was better than usual.   IMPRESSION:   1. Obstructive Sleep Apnea  (OSA), well treated with CPAP  2. Dysfunctions associated with sleep stages or arousal from sleep   RECOMMENDATIONS:   1. This study demonstrates resolution of the patient's obstructive sleep apnea with CPAP therapy. I will, therefore, start the patient on home CPAP treatment at a pressure of 10 cm via medium full face mask with (heated) humidity. The patient will be advised to be fully compliant with PAP therapy to improve sleep related symptoms and decrease long term cardiovascular risks. The patient should be reminded, that it may take up to 3 months to get fully used to using PAP with all planned sleep. The earlier full compliance is achieved, the better long term compliance tends to be. Please note that untreated obstructive sleep apnea may carry additional perioperative morbidity. Patients with significant obstructive sleep apnea should receive perioperative PAP therapy and the surgeons and particularly the anesthesiologist should be informed of the diagnosis and the severity of the sleep disordered breathing.  2. This study shows sleep fragmentation and abnormal sleep stage percentages; these are nonspecific findings and per se do not signify an intrinsic sleep disorder or a cause for the patient's sleep-related symptoms. Causes include (but are not limited to) the first night effect of the sleep study, circadian rhythm disturbances, medication effect or an underlying mood disorder or medical problem.  3. The patient should be cautioned not to drive, work at heights, or operate dangerous or heavy equipment when tired or sleepy. Review and reiteration of good sleep hygiene measures should be pursued with any patient. 4. The patient will be seen in follow-up in the sleep clinic at [facility] for discussion of the test results, symptom and treatment compliance review, further management strategies, etc. The referring provider will be notified of the test results.   I certify that I have reviewed the entire  raw data recording prior to the issuance of this report in accordance with the Standards of Accreditation of the American Academy of Sleep Medicine (AASM)   Star Age, MD, PhD Diplomat, American Board of Neurology and Sleep Medicine (Neurology and Sleep Medicine)

## 2020-03-13 NOTE — Progress Notes (Signed)
Patient referred by Glean Salvo, NP, seen by me on 01/29/20, diagnostic PSG on 02/12/20. Patient had a CPAP titration study on 03/02/20.  Please call and inform patient that I have entered an order for treatment with positive airway pressure (PAP) treatment for obstructive sleep apnea (OSA). She did well during the latest sleep study with CPAP. We will, therefore, arrange for a machine for home use through a DME (durable medical equipment) company of Her choice; and I will see the patient back in follow-up in about 10 weeks. Please also explain to the patient that I will be looking out for compliance data, which can be downloaded from the machine (stored on an SD card, that is inserted in the machine) or via remote access through a modem, that is built into the machine. At the time of the followup appointment we will discuss sleep study results and how it is going with PAP treatment at home. Please advise patient to bring Her machine at the time of the first FU visit, even though this is cumbersome. Bringing the machine for every visit after that will likely not be needed, but often helps for the first visit to troubleshoot if needed. Please re-enforce the importance of compliance with treatment and the need for [place] to monitor compliance data - often an insurance requirement and actually good feedback for the patient as far as how they are doing.  Also remind patient, that any interim PAP machine or mask issues should be first addressed with the DME company, as they can often help better with technical and mask fit issues. Please ask if patient has a preference regarding DME company.  Please also make sure, the patient has a follow-up appointment with me in about 10 weeks from the setup date, thanks. May see one of our nurse practitioners if needed for proper timing of the FU appointment.  Please fax or rout report to the referring provider. Thanks,   Star Age, MD, PhD [facility])

## 2020-03-16 ENCOUNTER — Telehealth: Payer: Self-pay

## 2020-03-16 NOTE — Telephone Encounter (Signed)
I called pt. I advised pt that Dr. Rexene Alberts reviewed their sleep study results and found that pt did well with the cpap. Dr. Rexene Alberts recommends that pt start a cpap at home. I reviewed PAP compliance expectations with the pt. Pt is agreeable to starting a CPAP. I advised pt that an order will be sent to a DME, [facility], and [facility] will call the pt within about one week after they file with the pt's insurance. [facility] will show the pt how to use the machine, fit for masks, and troubleshoot the CPAP if needed. A follow up appt was made for insurance purposes with Amy, NP on 06/25/20 at 1:30pm. Pt verbalized understanding to arrive 15 minutes early and bring their CPAP. A letter with all of this information in it will be mailed to the pt as a reminder. I verified with the pt that the address we have on file is correct. Pt verbalized understanding of results. Pt had no questions at this time but was encouraged to call back if questions arise. I have sent the order to [facility] and have received confirmation that they have received the order.

## 2020-03-16 NOTE — Telephone Encounter (Signed)
-----   Message from Star Age, MD sent at 03/13/2020 12:18 PM EST ----- Patient referred by Glean Salvo, NP, seen by me on 01/29/20, diagnostic PSG on 02/12/20. Patient had a CPAP titration study on 03/02/20.  Please call and inform patient that I have entered an order for treatment with positive airway pressure (PAP) treatment for obstructive sleep apnea (OSA). She did well during the latest sleep study with CPAP. We will, therefore, arrange for a machine for home use through a DME (durable medical equipment) company of Her choice; and I will see the patient back in follow-up in about 10 weeks. Please also explain to the patient that I will be looking out for compliance data, which can be downloaded from the machine (stored on an SD card, that is inserted in the machine) or via remote access through a modem, that is built into the machine. At the time of the followup appointment we will discuss sleep study results and how it is going with PAP treatment at home. Please advise patient to bring Her machine at the time of the first FU visit, even though this is cumbersome. Bringing the machine for every visit after that will likely not be needed, but often helps for the first visit to troubleshoot if needed. Please re-enforce the importance of compliance with treatment and the need for [place] to monitor compliance data - often an insurance requirement and actually good feedback for the patient as far as how they are doing.  Also remind patient, that any interim PAP machine or mask issues should be first addressed with the DME company, as they can often help better with technical and mask fit issues. Please ask if patient has a preference regarding DME company.  Please also make sure, the patient has a follow-up appointment with me in about 10 weeks from the setup date, thanks. May see one of our nurse practitioners if needed for proper timing of the FU appointment.  Please fax or rout report to the referring  provider. Thanks,   Star Age, MD, PhD [facility])

## 2020-03-25 ENCOUNTER — Ambulatory Visit: Payer: 59 | Admitting: Podiatry

## 2020-04-09 ENCOUNTER — Ambulatory Visit: Payer: 59 | Admitting: Internal Medicine

## 2020-05-06 ENCOUNTER — Ambulatory Visit: Payer: Self-pay | Admitting: Occupational Therapy

## 2020-05-06 ENCOUNTER — Telehealth: Payer: Self-pay | Admitting: Neurology

## 2020-05-06 NOTE — Telephone Encounter (Signed)
Pt is asking if since she has been waiting since 11-21 for CPAP can her DME be changed in hopes of getting her CPAP quicker, please call.

## 2020-05-06 NOTE — Progress Notes (Deleted)
Office Visit Note  Patient: Danielle Warren             Date of Birth: 1976/04/24           MRN: [id number]             PCP: Jonetta Osgood, MD Referring: Verlon Au, MD Visit Date: 05/20/2020 Occupation: @GUAROCC @  Subjective:  No chief complaint on file.   History of Present Illness: Danielle Warren is a 45 y.o. female ***   Activities of Daily Living:  Patient reports morning stiffness for *** {minute/hour:19697}.   Patient {ACTIONS;DENIES/REPORTS:21021675::"Denies"} nocturnal pain.  Difficulty dressing/grooming: {ACTIONS;DENIES/REPORTS:21021675::"Denies"} Difficulty climbing stairs: {ACTIONS;DENIES/REPORTS:21021675::"Denies"} Difficulty getting out of chair: {ACTIONS;DENIES/REPORTS:21021675::"Denies"} Difficulty using hands for taps, buttons, cutlery, and/or writing: {ACTIONS;DENIES/REPORTS:21021675::"Denies"}  No Rheumatology ROS completed.   PMFS History:  Patient Active Problem List   Diagnosis Date Noted  . CPAP (continuous positive airway pressure) dependence 12/20/2019  . Cervical radiculopathy 12/06/2019  . Lumbar radiculopathy 12/06/2019  . Arthralgia of right hand 12/06/2019  . Diabetes 1.5, managed as type 1 (HCC) 09/20/2019  . Lower abdominal pain 09/20/2019  . Ovarian mass 09/20/2019  . Chest pain 02/18/2019  . Acute cystitis 03/22/2018  . Frequent UTI 03/22/2018  . Pain in both lower extremities 06/23/2014  . Diabetes mellitus without complication (HCC)   . Headache 03/19/2014  . HTN (hypertension) 03/19/2014  . DM (diabetes mellitus), type 2 (HCC) 03/19/2014  . Hyperlipemia   . Fibromyalgia   . Arthritis 01/21/2014  . SI (sacroiliac) pain 01/21/2014  . Type II diabetes mellitus, uncontrolled (HCC) 05/13/2011    Past Medical History:  Diagnosis Date  . Arthritis    "lower back; right hip; maybe my neck" (03/20/2014)  . Asthma   . Chronic back pain    "inbeween shoulders going up into my neck; lower back" (03/20/2014)  .  Chronic bronchitis (HCC)    "get it about q yr" (03/20/2014)  . Degenerative disc disease   . Depression   . Fibromyalgia   . Gallstone   . GERD (gastroesophageal reflux disease)   . History of blood transfusion    "after one of my miscarriages"  . Hyperlipemia   . Hypertension   . Kidney stones   . Migraine    "had them when I was 7 or 45 yrs old; one day after sleep study on CPAP; one today" (03/20/2014)  . Positive TB test   . Type II diabetes mellitus (HCC)     Family History  Problem Relation Age of Onset  . Hypertension Mother   . COPD Mother   . Asthma Mother   . Diabetes Father   . Cancer Father        prostate/pancreatic   . Colon cancer Father   . Colon cancer Sister   . Liver cancer Sister   . Lupus Brother   . Asthma Brother   . Asthma Daughter   . Diabetes Sister    Past Surgical History:  Procedure Laterality Date  . ABDOMINAL HYSTERECTOMY  10/2012  . CENTRAL VENOUS CATHETER INSERTION Left 1999   chest  . CENTRAL VENOUS CATHETER REMOVAL  06/1997  . CERVICAL CERCLAGE  1997; 1999  . CESAREAN SECTION  1999  . COLONOSCOPY     late 1990's   . DILATION AND CURETTAGE OF UTERUS  1995; 1996   "miscarriages"  . ESOPHAGOGASTRODUODENOSCOPY     late 1990's  . LAPAROSCOPIC CHOLECYSTECTOMY  1997  . LITHOTRIPSY  1990's X 1  .  TUBAL LIGATION  1999  . WISDOM TOOTH EXTRACTION  1990's?   "all 4 at one time"   Social History   Social History Narrative  . Not on file   Immunization History  Administered Date(s) Administered  . Influenza-Unspecified 01/30/2014     Objective: Vital Signs: LMP 02/04/2011    Physical Exam   Musculoskeletal Exam: ***  CDAI Exam: CDAI Score: -- Patient Global: --; Provider Global: -- Swollen: --; Tender: -- Joint Exam 05/20/2020   No joint exam has been documented for this visit   There is currently no information documented on the homunculus. Go to the Rheumatology activity and complete the homunculus joint  exam.  Investigation: No additional findings.  Imaging: No results found.  Recent Labs: Lab Results  Component Value Date   WBC 10.6 (H) 10/22/2015   HGB 13.2 10/22/2015   PLT 380 10/22/2015   NA 132 (L) 10/22/2015   K 4.0 10/22/2015   CL 101 10/22/2015   CO2 22 10/22/2015   GLUCOSE 399 (H) 10/22/2015   BUN <5 (L) 10/22/2015   CREATININE 0.80 10/22/2015   BILITOT 0.3 03/20/2014   ALKPHOS 53 03/20/2014   AST 15 03/20/2014   ALT 25 03/20/2014   PROT 6.3 03/20/2014   ALBUMIN 2.9 (L) 03/20/2014   CALCIUM 9.1 10/22/2015   GFRAA >60 10/22/2015    Speciality Comments: No specialty comments available.  Procedures:  No procedures performed Allergies: Amoxicillin-pot clavulanate, Cephalosporins, Doxycycline, Macrolides and ketolides, Nitrofurantoin, Penicillins, Sulfa antibiotics, Sulfamethoxazole-trimethoprim, Sulfasalazine, Amoxicillin, Sulfur, and Telbivudine   Assessment / Plan:     Visit Diagnoses: Polyarthralgia  Numbness  Fibromyalgia  Cervical radiculopathy  Lumbar radiculopathy  SI (sacroiliac) pain  Pain in both lower extremities  Essential hypertension  History of hyperlipidemia  Diabetes 1.5, managed as type 1 (HCC)  Frequent UTI  CPAP (continuous positive airway pressure) dependence  Orders: No orders of the defined types were placed in this encounter.  No orders of the defined types were placed in this encounter.   Face-to-face time spent with patient was *** minutes. Greater than 50% of time was spent in counseling and coordination of care.  Follow-Up Instructions: No follow-ups on file.   Ofilia Neas, PA-C  Note - This record has been created using Dragon software.  Chart creation errors have been sought, but may not always  have been located. Such creation errors do not reflect on  the standard of medical care.

## 2020-05-06 NOTE — Telephone Encounter (Signed)
I called pt. She has not been set up on cpap yet. I advised her again of the national shortage. Changing DMEs might not get her a cpap faster and perhaps may delay it further. I recommended that she call [facility] again to check the status. She asked that I send her a Clinical cytogeneticist message with the phone number.

## 2020-05-07 ENCOUNTER — Telehealth: Payer: Self-pay | Admitting: Family Medicine

## 2020-05-07 NOTE — Telephone Encounter (Signed)
Received notice from [facility] that pt had appt today 05/07/20 w/ Dr. Abram Sander today.  [address] [place] [address] P) [phone] F) [phone] --FYI  --glh

## 2020-05-14 ENCOUNTER — Other Ambulatory Visit: Payer: Self-pay | Admitting: Podiatry

## 2020-05-14 DIAGNOSIS — M2141 Flat foot [pes planus] (acquired), right foot: Secondary | ICD-10-CM

## 2020-05-14 DIAGNOSIS — M2142 Flat foot [pes planus] (acquired), left foot: Secondary | ICD-10-CM

## 2020-05-20 ENCOUNTER — Ambulatory Visit: Payer: 59 | Admitting: Rheumatology

## 2020-05-20 DIAGNOSIS — E139 Other specified diabetes mellitus without complications: Secondary | ICD-10-CM

## 2020-05-20 DIAGNOSIS — M5412 Radiculopathy, cervical region: Secondary | ICD-10-CM

## 2020-05-20 DIAGNOSIS — M5416 Radiculopathy, lumbar region: Secondary | ICD-10-CM

## 2020-05-20 DIAGNOSIS — M79604 Pain in right leg: Secondary | ICD-10-CM

## 2020-05-20 DIAGNOSIS — M2550 Pain in unspecified joint: Secondary | ICD-10-CM

## 2020-05-20 DIAGNOSIS — Z9989 Dependence on other enabling machines and devices: Secondary | ICD-10-CM

## 2020-05-20 DIAGNOSIS — Z8639 Personal history of other endocrine, nutritional and metabolic disease: Secondary | ICD-10-CM

## 2020-05-20 DIAGNOSIS — I10 Essential (primary) hypertension: Secondary | ICD-10-CM

## 2020-05-20 DIAGNOSIS — R200 Anesthesia of skin: Secondary | ICD-10-CM

## 2020-05-20 DIAGNOSIS — M797 Fibromyalgia: Secondary | ICD-10-CM

## 2020-05-20 DIAGNOSIS — M533 Sacrococcygeal disorders, not elsewhere classified: Secondary | ICD-10-CM

## 2020-05-20 DIAGNOSIS — N390 Urinary tract infection, site not specified: Secondary | ICD-10-CM

## 2020-06-11 ENCOUNTER — Ambulatory Visit: Payer: 59 | Admitting: Rheumatology

## 2020-06-24 ENCOUNTER — Telehealth: Payer: Self-pay | Admitting: Family Medicine

## 2020-06-24 NOTE — Telephone Encounter (Signed)
Confirmed pt for 02/24 appointment.

## 2020-06-25 ENCOUNTER — Ambulatory Visit: Payer: Self-pay | Admitting: Family Medicine

## 2020-07-09 ENCOUNTER — Ambulatory Visit: Payer: Self-pay | Admitting: Internal Medicine

## 2020-07-09 NOTE — Progress Notes (Signed)
Office Visit Note  Patient: Danielle Warren             Date of Birth: 1975-08-23           MRN: [id number]             PCP: Letta Kocher, MD Referring: Drue Flirt, MD Visit Date: 07/22/2020 Occupation: @GUAROCC @  Subjective:  Rash and joint pain.   History of Present Illness: Danielle Warren is a 45 y.o. female in consultation per request of Dr. Quentin Cornwall.  According the patient she has had history of back pain since she was in her 65s.  She was diagnosed with fibromyalgia syndrome when she was in her 1s.  She states the back pain got worse while she was pregnant and gradually has been getting worse over time.  She has been in constant pain since 1997.  She states the neck pain started about 2 years ago.  She also has severe pain in her neck and shoulders.  She was evaluated by Dr. Clearance Coots.  Who did MRI of her cervical and lumbar spine it was consistent with degenerative disc disease.  She also had mild to moderate spinal stenosis in her cervical region.  She was referred to neurosurgery with [facility] but she did not have a very good experience.  She is waiting for another neurosurgeon to evaluate her.  She was also evaluated by Dr. Rexene Alberts for sleep apnea.  She is waiting for CPAP.  She states she gets intermittent rash on her face and her back for the last 2 years.  She has used some topical agents which are helpful.  There is no history of oral ulcers, nasal ulcers, sicca symptoms, Raynaud's phenomenon, malar rash, lymphadenopathy or photosensitivity.  She states she has discomfort in her multiple joints involving her shoulders, hands, wrists, and knees and ankles.  She also notices some swelling in her joints intermittently.  Gravida 4, para 2, miscarriages 2.  She lost 2 pregnancies in the second trimester.  There is no known family history of lupus.  Activities of Daily Living:  Patient reports morning stiffness for all day. Patient Reports nocturnal pain.    Difficulty dressing/grooming: Reports Difficulty climbing stairs: Reports Difficulty getting out of chair: Reports Difficulty using hands for taps, buttons, cutlery, and/or writing: Reports  Review of Systems  Constitutional: Positive for fatigue.  HENT: Positive for mouth dryness. Negative for mouth sores and nose dryness.   Eyes: Positive for dryness. Negative for pain and itching.  Respiratory: Positive for shortness of breath. Negative for difficulty breathing.   Cardiovascular: Positive for chest pain. Negative for palpitations.  Gastrointestinal: Positive for diarrhea, nausea and vomiting. Negative for blood in stool and constipation.  Endocrine: Negative for increased urination.  Genitourinary: Positive for involuntary urination. Negative for difficulty urinating.  Musculoskeletal: Positive for arthralgias, joint pain, joint swelling, myalgias, morning stiffness, muscle tenderness and myalgias.  Skin: Negative for color change, rash and redness.  Allergic/Immunologic: Negative for susceptible to infections.  Neurological: Positive for dizziness, numbness, headaches, parasthesias and memory loss.  Hematological: Positive for bruising/bleeding tendency.  Psychiatric/Behavioral: Positive for confusion and sleep disturbance.    PMFS History:  Patient Active Problem List   Diagnosis Date Noted  . CPAP (continuous positive airway pressure) dependence 12/20/2019  . Cervical radiculopathy 12/06/2019  . Lumbar radiculopathy 12/06/2019  . Arthralgia of right hand 12/06/2019  . Diabetes 1.5, managed as type 1 ([place]) 09/20/2019  . Lower abdominal pain 09/20/2019  .  Ovarian mass 09/20/2019  . Chest pain 02/18/2019  . Acute cystitis 03/22/2018  . Frequent UTI 03/22/2018  . Pain in both lower extremities 06/23/2014  . Diabetes mellitus without complication ([place])   . Headache 03/19/2014  . HTN (hypertension) 03/19/2014  . DM (diabetes mellitus), type 2 ([place]) 03/19/2014  . Hyperlipemia    . Fibromyalgia   . Arthritis 01/21/2014  . SI (sacroiliac) pain 01/21/2014  . Type II diabetes mellitus, uncontrolled ([place]) 05/13/2011    Past Medical History:  Diagnosis Date  . Arthritis    "lower back; right hip; maybe my neck" (03/20/2014)  . Asthma   . Chronic back pain    "inbeween shoulders going up into my neck; lower back" (03/20/2014)  . Chronic bronchitis ([place])    "get it about q yr" (03/20/2014)  . Degenerative disc disease   . Depression   . Fibromyalgia   . Gallstone   . GERD (gastroesophageal reflux disease)   . History of blood transfusion    "after one of my miscarriages"  . Hyperlipemia   . Hypertension   . Kidney stones   . Migraine    "had them when I was 7 or 45 yrs old; one day after sleep study on CPAP; one today" (03/20/2014)  . Positive TB test   . Type II diabetes mellitus (HCC)     Family History  Problem Relation Age of Onset  . Hypertension Mother   . COPD Mother   . Asthma Mother   . Diabetes Father   . Cancer Father        prostate/pancreatic   . Colon cancer Father   . Colon cancer Sister   . Liver cancer Sister   . Lupus Brother   . Asthma Brother   . Asthma Daughter   . Diabetes Sister   . Sickle cell anemia Brother   . Asthma Daughter    Past Surgical History:  Procedure Laterality Date  . ABDOMINAL HYSTERECTOMY  10/2012  . CENTRAL VENOUS CATHETER INSERTION Left 1999   chest  . CENTRAL VENOUS CATHETER REMOVAL  06/1997  . CERVICAL CERCLAGE  1997; 1999  . CESAREAN SECTION  1999  . COLONOSCOPY     late 1990's   . DILATION AND CURETTAGE OF UTERUS  1995; 1996   "miscarriages"  . ESOPHAGOGASTRODUODENOSCOPY     late 1990's  . LAPAROSCOPIC CHOLECYSTECTOMY  1997  . LITHOTRIPSY  1990's X 1  . TUBAL LIGATION  1999  . WISDOM TOOTH EXTRACTION  1990's?   "all 4 at one time"   Social History   Social History Narrative  . Not on file   Immunization History  Administered Date(s) Administered  . Influenza-Unspecified  01/30/2014     Objective: Vital Signs: BP (!) 145/99 (BP Location: Right Arm, Patient Position: Sitting, Cuff Size: Normal)   Pulse (!) 123   Ht 5\' 2"  (1.575 m)   Wt 158 lb 3.2 oz (71.8 kg)   LMP 02/04/2011   BMI 28.94 kg/m    Physical Exam Vitals and nursing note reviewed.  Constitutional:      Appearance: She is well-developed.  HENT:     Head: Normocephalic and atraumatic.  Eyes:     Conjunctiva/sclera: Conjunctivae normal.  Cardiovascular:     Rate and Rhythm: Normal rate and regular rhythm.     Heart sounds: Normal heart sounds.  Pulmonary:     Effort: Pulmonary effort is normal.     Breath sounds: Normal breath sounds.  Abdominal:  General: Bowel sounds are normal.     Palpations: Abdomen is soft.  Musculoskeletal:     Cervical back: Normal range of motion.  Lymphadenopathy:     Cervical: No cervical adenopathy.  Skin:    General: Skin is warm and dry.     Capillary Refill: Capillary refill takes less than 2 seconds.  Neurological:     Mental Status: She is alert and oriented to person, place, and time.  Psychiatric:        Behavior: Behavior normal.      Musculoskeletal Exam: She has painful limited range of motion of her cervical spine.  She has been followed very limited range of motion for lumbar spine.  Shoulder joints, elbow joints, wrist joints, MCPs PIPs and DIPs with good range of motion with no synovitis.  Hip joints were in good range of motion.  She had tenderness over right trochanteric region.  She had painful range of motion of bilateral knee joints without any warmth swelling effusion.  She had tenderness across ankles or MTPs but no synovitis was noted.  She had generalized hyperalgesia and positive tender points.  CDAI Exam: CDAI Score: - Patient Global: -; Provider Global: - Swollen: -; Tender: - Joint Exam 07/22/2020   No joint exam has been documented for this visit   There is currently no information documented on the homunculus. Go  to the Rheumatology activity and complete the homunculus joint exam.  Investigation: No additional findings.  Imaging: XR KNEE 3 VIEW LEFT  Result Date: 07/22/2020 Moderate medial compartment narrowing and moderate patellofemoral narrowing was noted.  No chondrocalcinosis was noted. Impression: These findings are consistent with moderate osteoarthritis and moderate chondromalacia patella.  XR KNEE 3 VIEW RIGHT  Result Date: 07/22/2020 Mild medial compartment narrowing and mild patellofemoral narrowing was noted.  No chondrocalcinosis was noted. Impression: These findings are consistent mild osteoarthritis and mild chondromalacia patella.   Recent Labs: Lab Results  Component Value Date   WBC 10.6 (H) 10/22/2015   HGB 13.2 10/22/2015   PLT 380 10/22/2015   NA 132 (L) 10/22/2015   K 4.0 10/22/2015   CL 101 10/22/2015   CO2 22 10/22/2015   GLUCOSE 399 (H) 10/22/2015   BUN <5 (L) 10/22/2015   CREATININE 0.80 10/22/2015   BILITOT 0.3 03/20/2014   ALKPHOS 53 03/20/2014   AST 15 03/20/2014   ALT 25 03/20/2014   PROT 6.3 03/20/2014   ALBUMIN 2.9 (L) 03/20/2014   CALCIUM 9.1 10/22/2015   GFRAA >60 10/22/2015   August 05,2021 ANA negative, RF -, anti-CCP negative, sed rate 46, CRP 3  Speciality Comments: No specialty comments available.  Procedures:  No procedures performed Allergies: Amoxicillin-pot clavulanate, Cephalosporins, Doxycycline, Macrolides and ketolides, Nitrofurantoin, Penicillins, Sulfa antibiotics, Sulfamethoxazole-trimethoprim, Sulfasalazine, Amoxicillin, Elemental sulfur, and Telbivudine   Assessment / Plan:     Visit Diagnoses: Polyarthralgia -she complains of pain and discomfort in all of her joints and muscles.  No synovitis was noted on the examination.  07/23/19: uric acid 2.5, December 05, 2019 RF negative, anti-CCP negative, ANA negative  DDD (degenerative disc disease), cervical -she complains of lot of pain and discomfort in the cervical spine.  She had  painful range of motion.  I reviewed MRI results with her.  August15, 2021 MRI mild to moderate spinal stenosis.  She is awaiting neurosurgery evaluation.  DDD (degenerative disc disease), lumbar -she has severe lower back discomfort.  She is having difficulty walking.  I reviewed MRI results from December 15, 2019  MRI showed degenerative changes and facet joint arthropathy.  The findings were discussed with the patient.  She is waiting to see a neurosurgeon.  Pain in both hands-she gives history of pain and swelling in her bilateral hands.  She states she has had x-rays in the past and was told that she has osteoarthritis.  I do not see any synovitis on examination.  Chronic pain of both knees -she complains of pain and discomfort in her bilateral knee joints.  No warmth swelling or effusion was noted.  Have given her a handout on knee joint exercises.  Plan: XR KNEE 3 VIEW RIGHT, XR KNEE 3 VIEW LEFT.  X-ray of bilateral knee joint showed mild to moderate osteoarthritis.  X-ray findings were discussed with the patient.  Trochanteric bursitis, right hip-she had tenderness over right trochanteric bursa consistent with trochanteric bursitis.  Rash-patient states she developed a rash on her face which she responded to the topical agents.  ANA is negative.  There is no history of oral ulcers, nasal ulcers, malar rash, photosensitivity, sicca symptoms, Raynaud's phenomenon or lymphadenopathy.  Numbness-she complains of numbness in her hands which goes to her elbows.  It may be related to her cervical spine.  She will be getting neurosurgical evaluation.  Fibromyalgia-she gives history of fibromyalgia since she was in her 93s.  She continues to have generalized pain, hyperalgesia and positive tender points.  Should be going to pain management.  Primary hypertension-her blood pressure is elevated most likely due to pain.  History of hyperlipidemia  Uncontrolled type 2 diabetes mellitus with hyperglycemia  (HCC)  CPAP (continuous positive airway pressure) dependence-patient is awaiting a CPAP at this point.  She has been followed by Dr. Rexene Alberts.  Orders: Orders Placed This Encounter  Procedures  . XR KNEE 3 VIEW RIGHT  . XR KNEE 3 VIEW LEFT   No orders of the defined types were placed in this encounter.    Follow-Up Instructions: Return if symptoms worsen or fail to improve, for Degenerative disc disease.   Bo Merino, MD  Note - This record has been created using Editor, commissioning.  Chart creation errors have been sought, but may not always  have been located. Such creation errors do not reflect on  the standard of medical care.

## 2020-07-22 ENCOUNTER — Encounter: Payer: Self-pay | Admitting: Rheumatology

## 2020-07-22 ENCOUNTER — Ambulatory Visit: Payer: Self-pay

## 2020-07-22 ENCOUNTER — Other Ambulatory Visit: Payer: Self-pay

## 2020-07-22 ENCOUNTER — Other Ambulatory Visit: Payer: 59

## 2020-07-22 ENCOUNTER — Ambulatory Visit (INDEPENDENT_AMBULATORY_CARE_PROVIDER_SITE_OTHER): Payer: 59 | Admitting: Rheumatology

## 2020-07-22 VITALS — BP 145/99 | HR 123 | Ht 62.0 in | Wt 158.2 lb

## 2020-07-22 DIAGNOSIS — R200 Anesthesia of skin: Secondary | ICD-10-CM

## 2020-07-22 DIAGNOSIS — M25562 Pain in left knee: Secondary | ICD-10-CM | POA: Diagnosis not present

## 2020-07-22 DIAGNOSIS — E1165 Type 2 diabetes mellitus with hyperglycemia: Secondary | ICD-10-CM

## 2020-07-22 DIAGNOSIS — M25561 Pain in right knee: Secondary | ICD-10-CM | POA: Diagnosis not present

## 2020-07-22 DIAGNOSIS — M79641 Pain in right hand: Secondary | ICD-10-CM | POA: Diagnosis not present

## 2020-07-22 DIAGNOSIS — M5136 Other intervertebral disc degeneration, lumbar region: Secondary | ICD-10-CM | POA: Diagnosis not present

## 2020-07-22 DIAGNOSIS — Z8639 Personal history of other endocrine, nutritional and metabolic disease: Secondary | ICD-10-CM

## 2020-07-22 DIAGNOSIS — G8929 Other chronic pain: Secondary | ICD-10-CM

## 2020-07-22 DIAGNOSIS — M5030 Other cervical disc degeneration, unspecified cervical region: Secondary | ICD-10-CM

## 2020-07-22 DIAGNOSIS — I10 Essential (primary) hypertension: Secondary | ICD-10-CM

## 2020-07-22 DIAGNOSIS — R21 Rash and other nonspecific skin eruption: Secondary | ICD-10-CM

## 2020-07-22 DIAGNOSIS — M5416 Radiculopathy, lumbar region: Secondary | ICD-10-CM

## 2020-07-22 DIAGNOSIS — M2550 Pain in unspecified joint: Secondary | ICD-10-CM | POA: Diagnosis not present

## 2020-07-22 DIAGNOSIS — M7061 Trochanteric bursitis, right hip: Secondary | ICD-10-CM

## 2020-07-22 DIAGNOSIS — Z9989 Dependence on other enabling machines and devices: Secondary | ICD-10-CM

## 2020-07-22 DIAGNOSIS — M79661 Pain in right lower leg: Secondary | ICD-10-CM

## 2020-07-22 DIAGNOSIS — M797 Fibromyalgia: Secondary | ICD-10-CM

## 2020-07-22 DIAGNOSIS — M79642 Pain in left hand: Secondary | ICD-10-CM

## 2020-07-22 DIAGNOSIS — M5412 Radiculopathy, cervical region: Secondary | ICD-10-CM

## 2020-07-22 NOTE — Patient Instructions (Signed)
Hand Exercises Hand exercises can be helpful for almost anyone. These exercises can strengthen the hands, improve flexibility and movement, and increase blood flow to the hands. These results can make work and daily tasks easier. Hand exercises can be especially helpful for people who have joint pain from arthritis or have nerve damage from overuse (carpal tunnel syndrome). These exercises can also help people who have injured a hand. Exercises Most of these hand exercises are gentle stretching and motion exercises. It is usually safe to do them often throughout the day. Warming up your hands before exercise may help to reduce stiffness. You can do this with gentle massage or by placing your hands in warm water for 10-15 minutes. It is normal to feel some stretching, pulling, tightness, or mild discomfort as you begin new exercises. This will gradually improve. Stop an exercise right away if you feel sudden, severe pain or your pain gets worse. Ask your health care provider which exercises are best for you. Knuckle bend or "claw" fist 1. Stand or sit with your arm, hand, and all five fingers pointed straight up. Make sure to keep your wrist straight during the exercise. 2. Gently bend your fingers down toward your palm until the tips of your fingers are touching the top of your palm. Keep your big knuckle straight and just bend the small knuckles in your fingers. 3. Hold this position for __________ seconds. 4. Straighten (extend) your fingers back to the starting position. Repeat this exercise 5-10 times with each hand. Full finger fist 1. Stand or sit with your arm, hand, and all five fingers pointed straight up. Make sure to keep your wrist straight during the exercise. 2. Gently bend your fingers into your palm until the tips of your fingers are touching the middle of your palm. 3. Hold this position for __________ seconds. 4. Extend your fingers back to the starting position, stretching every  joint fully. Repeat this exercise 5-10 times with each hand. Straight fist 1. Stand or sit with your arm, hand, and all five fingers pointed straight up. Make sure to keep your wrist straight during the exercise. 2. Gently bend your fingers at the big knuckle, where your fingers meet your hand, and the middle knuckle. Keep the knuckle at the tips of your fingers straight and try to touch the bottom of your palm. 3. Hold this position for __________ seconds. 4. Extend your fingers back to the starting position, stretching every joint fully. Repeat this exercise 5-10 times with each hand. Tabletop 1. Stand or sit with your arm, hand, and all five fingers pointed straight up. Make sure to keep your wrist straight during the exercise. 2. Gently bend your fingers at the big knuckle, where your fingers meet your hand, as far down as you can while keeping the small knuckles in your fingers straight. Think of forming a tabletop with your fingers. 3. Hold this position for __________ seconds. 4. Extend your fingers back to the starting position, stretching every joint fully. Repeat this exercise 5-10 times with each hand. Finger spread 1. Place your hand flat on a table with your palm facing down. Make sure your wrist stays straight as you do this exercise. 2. Spread your fingers and thumb apart from each other as far as you can until you feel a gentle stretch. Hold this position for __________ seconds. 3. Bring your fingers and thumb tight together again. Hold this position for __________ seconds. Repeat this exercise 5-10 times with each hand.   Making circles 1. Stand or sit with your arm, hand, and all five fingers pointed straight up. Make sure to keep your wrist straight during the exercise. 2. Make a circle by touching the tip of your thumb to the tip of your index finger. 3. Hold for __________ seconds. Then open your hand wide. 4. Repeat this motion with your thumb and each finger on your  hand. Repeat this exercise 5-10 times with each hand. Thumb motion 1. Sit with your forearm resting on a table and your wrist straight. Your thumb should be facing up toward the ceiling. Keep your fingers relaxed as you move your thumb. 2. Lift your thumb up as high as you can toward the ceiling. Hold for __________ seconds. 3. Bend your thumb across your palm as far as you can, reaching the tip of your thumb for the small finger (pinkie) side of your palm. Hold for __________ seconds. Repeat this exercise 5-10 times with each hand. Grip strengthening 1. Hold a stress ball or other soft ball in the middle of your hand. 2. Slowly increase the pressure, squeezing the ball as much as you can without causing pain. Think of bringing the tips of your fingers into the middle of your palm. All of your finger joints should bend when doing this exercise. 3. Hold your squeeze for __________ seconds, then relax. Repeat this exercise 5-10 times with each hand.   Contact a health care provider if:  Your hand pain or discomfort gets much worse when you do an exercise.  Your hand pain or discomfort does not improve within 2 hours after you exercise. If you have any of these problems, stop doing these exercises right away. Do not do them again unless your health care provider says that you can. Get help right away if:  You develop sudden, severe hand pain or swelling. If this happens, stop doing these exercises right away. Do not do them again unless your health care provider says that you can. This information is not intended to replace advice given to you by your health care provider. Make sure you discuss any questions you have with your health care provider. Document Revised: 08/09/2018 Document Reviewed: 04/19/2018 Elsevier Patient Education  2021 [place] for Nurse Practitioners, 15(4), [phone]. Retrieved February 05, 2018 from http://clinicalkey.com/nursing">  Knee Exercises Ask your  health care provider which exercises are safe for you. Do exercises exactly as told by your health care provider and adjust them as directed. It is normal to feel mild stretching, pulling, tightness, or discomfort as you do these exercises. Stop right away if you feel sudden pain or your pain gets worse. Do not begin these exercises until told by your health care provider. Stretching and range-of-motion exercises These exercises warm up your muscles and joints and improve the movement and flexibility of your knee. These exercises also help to relieve pain and swelling. Knee extension, prone 1. Lie on your abdomen (prone position) on a bed. 2. Place your left / right knee just beyond the edge of the surface so your knee is not on the bed. You can put a towel under your left / right thigh just above your kneecap for comfort. 3. Relax your leg muscles and allow gravity to straighten your knee (extension). You should feel a stretch behind your left / right knee. 4. Hold this position for __________ seconds. 5. Scoot up so your knee is supported between repetitions. Repeat __________ times. Complete this exercise __________ times a day.  Knee flexion, active 1. Lie on your back with both legs straight. If this causes back discomfort, bend your left / right knee so your foot is flat on the floor. 2. Slowly slide your left / right heel back toward your buttocks. Stop when you feel a gentle stretch in the front of your knee or thigh (flexion). 3. Hold this position for __________ seconds. 4. Slowly slide your left / right heel back to the starting position. Repeat __________ times. Complete this exercise __________ times a day.   Quadriceps stretch, prone 1. Lie on your abdomen on a firm surface, such as a bed or padded floor. 2. Bend your left / right knee and hold your ankle. If you cannot reach your ankle or pant leg, loop a belt around your foot and grab the belt instead. 3. Gently pull your heel  toward your buttocks. Your knee should not slide out to the side. You should feel a stretch in the front of your thigh and knee (quadriceps). 4. Hold this position for __________ seconds. Repeat __________ times. Complete this exercise __________ times a day.   Hamstring, supine 1. Lie on your back (supine position). 2. Loop a belt or towel over the ball of your left / right foot. The ball of your foot is on the walking surface, right under your toes. 3. Straighten your left / right knee and slowly pull on the belt to raise your leg until you feel a gentle stretch behind your knee (hamstring). ? Do not let your knee bend while you do this. ? Keep your other leg flat on the floor. 4. Hold this position for __________ seconds. Repeat __________ times. Complete this exercise __________ times a day. Strengthening exercises These exercises build strength and endurance in your knee. Endurance is the ability to use your muscles for a long time, even after they get tired. Quadriceps, isometric This exercise stretches the muscles in front of your thigh (quadriceps) without moving your knee joint (isometric). 1. Lie on your back with your left / right leg extended and your other knee bent. Put a rolled towel or small pillow under your knee if told by your health care provider. 2. Slowly tense the muscles in the front of your left / right thigh. You should see your kneecap slide up toward your hip or see increased dimpling just above the knee. This motion will push the back of the knee toward the floor. 3. For __________ seconds, hold the muscle as tight as you can without increasing your pain. 4. Relax the muscles slowly and completely. Repeat __________ times. Complete this exercise __________ times a day.   Straight leg raises This exercise stretches the muscles in front of your thigh (quadriceps) and the muscles that move your hips (hip flexors). 1. Lie on your back with your left / right leg extended  and your other knee bent. 2. Tense the muscles in the front of your left / right thigh. You should see your kneecap slide up or see increased dimpling just above the knee. Your thigh may even shake a bit. 3. Keep these muscles tight as you raise your leg 4-6 inches (10-15 cm) off the floor. Do not let your knee bend. 4. Hold this position for __________ seconds. 5. Keep these muscles tense as you lower your leg. 6. Relax your muscles slowly and completely after each repetition. Repeat __________ times. Complete this exercise __________ times a day. Hamstring, isometric 1. Lie on your back on a firm  surface. 2. Bend your left / right knee about __________ degrees. 3. Dig your left / right heel into the surface as if you are trying to pull it toward your buttocks. Tighten the muscles in the back of your thighs (hamstring) to "dig" as hard as you can without increasing any pain. 4. Hold this position for __________ seconds. 5. Release the tension gradually and allow your muscles to relax completely for __________ seconds after each repetition. Repeat __________ times. Complete this exercise __________ times a day. Hamstring curls If told by your health care provider, do this exercise while wearing ankle weights. Begin with __________ lb weights. Then increase the weight by 1 lb (0.5 kg) increments. Do not wear ankle weights that are more than __________ lb. 1. Lie on your abdomen with your legs straight. 2. Bend your left / right knee as far as you can without feeling pain. Keep your hips flat against the floor. 3. Hold this position for __________ seconds. 4. Slowly lower your leg to the starting position. Repeat __________ times. Complete this exercise __________ times a day.   Squats This exercise strengthens the muscles in front of your thigh and knee (quadriceps). 1. Stand in front of a table, with your feet and knees pointing straight ahead. You may rest your hands on the table for balance  but not for support. 2. Slowly bend your knees and lower your hips like you are going to sit in a chair. ? Keep your weight over your heels, not over your toes. ? Keep your lower legs upright so they are parallel with the table legs. ? Do not let your hips go lower than your knees. ? Do not bend lower than told by your health care provider. ? If your knee pain increases, do not bend as low. 3. Hold the squat position for __________ seconds. 4. Slowly push with your legs to return to standing. Do not use your hands to pull yourself to standing. Repeat __________ times. Complete this exercise __________ times a day. Wall slides This exercise strengthens the muscles in front of your thigh and knee (quadriceps). 1. Lean your back against a smooth wall or door, and walk your feet out 18-24 inches (46-61 cm) from it. 2. Place your feet hip-width apart. 3. Slowly slide down the wall or door until your knees bend __________ degrees. Keep your knees over your heels, not over your toes. Keep your knees in line with your hips. 4. Hold this position for __________ seconds. Repeat __________ times. Complete this exercise __________ times a day.   Straight leg raises This exercise strengthens the muscles that rotate the leg at the hip and move it away from your body (hip abductors). 1. Lie on your side with your left / right leg in the top position. Lie so your head, shoulder, knee, and hip line up. You may bend your bottom knee to help you keep your balance. 2. Roll your hips slightly forward so your hips are stacked directly over each other and your left / right knee is facing forward. 3. Leading with your heel, lift your top leg 4-6 inches (10-15 cm). You should feel the muscles in your outer hip lifting. ? Do not let your foot drift forward. ? Do not let your knee roll toward the ceiling. 4. Hold this position for __________ seconds. 5. Slowly return your leg to the starting position. 6. Let your  muscles relax completely after each repetition. Repeat __________ times. Complete this exercise __________  times a day.   Straight leg raises This exercise stretches the muscles that move your hips away from the front of the pelvis (hip extensors). 1. Lie on your abdomen on a firm surface. You can put a pillow under your hips if that is more comfortable. 2. Tense the muscles in your buttocks and lift your left / right leg about 4-6 inches (10-15 cm). Keep your knee straight as you lift your leg. 3. Hold this position for __________ seconds. 4. Slowly lower your leg to the starting position. 5. Let your leg relax completely after each repetition. Repeat __________ times. Complete this exercise __________ times a day. This information is not intended to replace advice given to you by your health care provider. Make sure you discuss any questions you have with your health care provider. Document Revised: 02/06/2018 Document Reviewed: 02/06/2018 Elsevier Patient Education  2021 [employer].

## 2020-07-27 ENCOUNTER — Ambulatory Visit (INDEPENDENT_AMBULATORY_CARE_PROVIDER_SITE_OTHER): Payer: 59 | Admitting: Podiatry

## 2020-07-27 ENCOUNTER — Other Ambulatory Visit: Payer: Self-pay

## 2020-07-27 DIAGNOSIS — M2142 Flat foot [pes planus] (acquired), left foot: Secondary | ICD-10-CM

## 2020-07-27 DIAGNOSIS — E1165 Type 2 diabetes mellitus with hyperglycemia: Secondary | ICD-10-CM

## 2020-07-27 DIAGNOSIS — M2141 Flat foot [pes planus] (acquired), right foot: Secondary | ICD-10-CM

## 2020-07-27 NOTE — Progress Notes (Signed)
The patient presented to the office today to pick up diabetic shoes and 3 pair diabetic custom inserts.  1 pair of inserts were put in the shoes and the shoes were fitted to the patient. The patient states they are comfortable and free of defect. She was satisfied with the fit of the shoe. Instructions for break in and wear were dispensed. The patient signed the delivery documentation and break in instruction form.  If any questions or concerns arise, she is instructed to call.

## 2020-07-31 ENCOUNTER — Encounter: Payer: Self-pay | Admitting: Physical Medicine and Rehabilitation

## 2020-08-13 ENCOUNTER — Telehealth: Payer: Self-pay | Admitting: Neurology

## 2020-08-13 NOTE — Telephone Encounter (Signed)
Noted we will send orders to [facility] for the patient. Orders faxed today.

## 2020-08-13 NOTE — Telephone Encounter (Signed)
Pt called, I have found a DME that is in my network. Could you send CPAP prescription?  [facility] [address] [place], [place] [address] Phone # [phone]

## 2020-09-16 ENCOUNTER — Ambulatory Visit: Payer: Self-pay | Admitting: Internal Medicine

## 2020-09-25 ENCOUNTER — Encounter: Payer: Self-pay | Admitting: Physical Medicine and Rehabilitation

## 2020-09-25 ENCOUNTER — Encounter: Payer: 59 | Attending: Physical Medicine and Rehabilitation | Admitting: Physical Medicine and Rehabilitation

## 2020-09-25 ENCOUNTER — Other Ambulatory Visit: Payer: Self-pay

## 2020-09-25 VITALS — BP 172/97 | HR 127 | Ht 63.0 in | Wt 158.0 lb

## 2020-09-25 DIAGNOSIS — M48061 Spinal stenosis, lumbar region without neurogenic claudication: Secondary | ICD-10-CM | POA: Insufficient documentation

## 2020-09-25 MED ORDER — AMITRIPTYLINE HCL 10 MG PO TABS: 10.0000 mg | ORAL_TABLET | Freq: Every day | ORAL | 1 refills | Status: DC

## 2020-09-25 NOTE — Progress Notes (Signed)
Subjective:    Patient ID: Danielle Warren, female    DOB: 1975-10-17, 45 y.o.   MRN: [id number]  HPI  Danielle Warren is a 45 year old woman who presents to establish care for fibromyalgia.   -pain is diffuse- in most muscles and joints -present even when sitting -has had poor quality of life due to pain.  -sleeping poorly due to pain. -takes sleeping pill at doesn't sleep  -she has tried lyrica. -she is taking gabapentin and flexeril -she used to take oxycodone and a patch.  -she has tried extra strength tylenol, bengay, tiger balm cream   Pain Inventory Average Pain 10 Pain Right Now 8 My pain is constant, sharp, burning, stabbing and aching  In the last 24 hours, has pain interfered with the following? General activity 10 Relation with others 7 Enjoyment of life 10 What TIME of day is your pain at its worst? morning  and night Sleep (in general) Poor  Pain is worse with: walking, bending, sitting, standing and some activites Pain improves with: medication Relief from Meds: 3  walk with assistance use a walker use a wheelchair  not employed: date last employed . disabled: date disabled .  weakness numbness tingling trouble walking dizziness  new  new    Family History  Problem Relation Age of Onset  . Hypertension Mother   . COPD Mother   . Asthma Mother   . Diabetes Father   . Cancer Father        prostate/pancreatic   . Colon cancer Father   . Colon cancer Sister   . Liver cancer Sister   . Lupus Brother   . Asthma Brother   . Asthma Daughter   . Diabetes Sister   . Sickle cell anemia Brother   . Asthma Daughter    Social History   Socioeconomic History  . Marital status: Legally Separated    Spouse name: Not on file  . Number of children: Not on file  . Years of education: Not on file  . Highest education level: Not on file  Occupational History  . Not on file  Tobacco Use  . Smoking status: Current Every Day Smoker    Packs/day:  0.50    Years: 17.00    Pack years: 8.50    Types: Cigarettes  . Smokeless tobacco: Never Used  . Tobacco comment: patient is aware she needs to quit   Vaping Use  . Vaping Use: Never used  Substance and Sexual Activity  . Alcohol use: Yes    Alcohol/week: 0.0 standard drinks    Comment: 03/20/2014 "maybe once/year I'll have a drink"  . Drug use: No  . Sexual activity: Yes    Partners: Male    Birth control/protection: Surgical  Other Topics Concern  . Not on file  Social History Narrative  . Not on file   Social Determinants of Health   Financial Resource Strain: Not on file  Food Insecurity: Not on file  Transportation Needs: Not on file  Physical Activity: Not on file  Stress: Not on file  Social Connections: Not on file   Past Surgical History:  Procedure Laterality Date  . ABDOMINAL HYSTERECTOMY  10/2012  . CENTRAL VENOUS CATHETER INSERTION Left 1999   chest  . CENTRAL VENOUS CATHETER REMOVAL  06/1997  . CERVICAL CERCLAGE  1997; 1999  . CESAREAN SECTION  1999  . COLONOSCOPY     late 1990's   . DILATION AND CURETTAGE OF UTERUS  1995; 1996   "miscarriages"  . ESOPHAGOGASTRODUODENOSCOPY     late 1990's  . LAPAROSCOPIC CHOLECYSTECTOMY  1997  . LITHOTRIPSY  1990's X 1  . TUBAL LIGATION  1999  . WISDOM TOOTH EXTRACTION  1990's?   "all 4 at one time"   Past Medical History:  Diagnosis Date  . Arthritis    "lower back; right hip; maybe my neck" (03/20/2014)  . Asthma   . Chronic back pain    "inbeween shoulders going up into my neck; lower back" (03/20/2014)  . Chronic bronchitis ([place])    "get it about q yr" (03/20/2014)  . Degenerative disc disease   . Depression   . Fibromyalgia   . Gallstone   . GERD (gastroesophageal reflux disease)   . History of blood transfusion    "after one of my miscarriages"  . Hyperlipemia   . Hypertension   . Kidney stones   . Migraine    "had them when I was 7 or 46 yrs old; one day after sleep study on CPAP; one today"  (03/20/2014)  . Positive TB test   . Type II diabetes mellitus (H[facility])    Pulse (!) 127   Ht 5\' 3"  (1.6 m)   Wt 158 lb (71.7 kg)   LMP 02/04/2011   SpO2 98%   BMI 27.99 kg/m   Opioid Risk Score:   Fall Risk Score:  `1  Depression screen PHQ 2/9  No flowsheet data found.  Review of Systems  Constitutional: Positive for appetite change and diaphoresis.  HENT: Negative.   Eyes: Negative.   Respiratory: Positive for apnea.   Cardiovascular: Negative.   Gastrointestinal: Positive for diarrhea, nausea and vomiting.  Endocrine: Negative.   Genitourinary: Positive for difficulty urinating.  Musculoskeletal: Positive for arthralgias, back pain, gait problem, myalgias, neck pain and neck stiffness.  Skin: Negative.   Allergic/Immunologic: Negative.   Neurological: Positive for tremors, weakness and numbness.       Tingling   Hematological: Negative.   Psychiatric/Behavioral: Positive for confusion and dysphoric mood.  All other systems reviewed and are negative.      Objective:   Physical Exam  Gen: no distress, normal appearing HEENT: oral mucosa pink and moist, NCAT Cardio: Reg rate Chest: normal effort, normal rate of breathing Abd: soft, non-distended Ext: no edema Psych: pleasant, normal affect Skin: intact Neuro: Alert and oriented x3.  Musculoskeletal: Diffuse tenderness. Wheeled in wheelchair.      Assessment & Plan:  1) Chronic Pain Syndrome secondary to fibromyalgia.  -Discussed current symptoms of pain and history of pain.  -Discussed benefits of exercise in reducing pain. -Discussed following foods that may reduce pain: 1) Ginger (especially studied for arthritis)- reduce leukotriene production to decrease inflammation 2) Blueberries- high in phytonutrients that decrease inflammation 3) Salmon- marine omega-3s reduce joint swelling and pain 4) Pumpkin seeds- reduce inflammation 5) dark chocolate- reduces inflammation 6) turmeric- reduces  inflammation 7) tart cherries - reduce pain and stiffness 8) extra virgin olive oil - its compound olecanthal helps to block prostaglandins  9) chili peppers- can be eaten or applied topically via capsaicin 10) mint- helpful for headache, muscle aches, joint pain, and itching 11) garlic- reduces inflammation  Link to further information on diet for chronic pain: [email or web address]  2) Insomnia: -start amitriptyline 10mg  HS  3) Impaired balance:  -will provide script for a rollator.

## 2020-09-25 NOTE — Patient Instructions (Signed)
-  Discussed following foods that may reduce pain: 1) Ginger (especially studied for arthritis)- reduce leukotriene production to decrease inflammation 2) Blueberries- high in phytonutrients that decrease inflammation 3) Salmon- marine omega-3s reduce joint swelling and pain 4) Pumpkin seeds- reduce inflammation 5) dark chocolate- reduces inflammation 6) turmeric- reduces inflammation 7) tart cherries - reduce pain and stiffness 8) extra virgin olive oil - its compound olecanthal helps to block prostaglandins  9) chili peppers- can be eaten or applied topically via capsaicin 10) mint- helpful for headache, muscle aches, joint pain, and itching 11) garlic- reduces inflammation  Link to further information on diet for chronic pain: [email or web address]

## 2020-09-30 ENCOUNTER — Telehealth: Payer: Self-pay

## 2020-09-30 NOTE — Telephone Encounter (Signed)
Patient aware Dr. Ranell Patrick is not in the office:  Patient started RX Amitriptyline last week. It is not helping. She still complains of back, neck, shoulder & pain in the knees.   Call back phone [phone].  Please advise.

## 2020-10-01 ENCOUNTER — Telehealth: Payer: Self-pay | Admitting: Physical Medicine and Rehabilitation

## 2020-10-01 ENCOUNTER — Other Ambulatory Visit: Payer: Self-pay

## 2020-10-01 ENCOUNTER — Encounter: Payer: Self-pay | Admitting: Physical Medicine and Rehabilitation

## 2020-10-01 ENCOUNTER — Encounter: Payer: 59 | Attending: Physical Medicine and Rehabilitation | Admitting: Physical Medicine and Rehabilitation

## 2020-10-01 VITALS — Ht 63.0 in | Wt 169.0 lb

## 2020-10-01 DIAGNOSIS — G894 Chronic pain syndrome: Secondary | ICD-10-CM

## 2020-10-01 DIAGNOSIS — M48061 Spinal stenosis, lumbar region without neurogenic claudication: Secondary | ICD-10-CM | POA: Insufficient documentation

## 2020-10-01 DIAGNOSIS — Z79891 Long term (current) use of opiate analgesic: Secondary | ICD-10-CM

## 2020-10-01 DIAGNOSIS — Z5181 Encounter for therapeutic drug level monitoring: Secondary | ICD-10-CM

## 2020-10-01 NOTE — Telephone Encounter (Signed)
Left her voicemail and set her up for phone visit at noon

## 2020-10-01 NOTE — Telephone Encounter (Signed)
UDS ordered and Opioid Treatment Agreement/CSA papers placed up front for pt to sign when comes in for lab.

## 2020-10-01 NOTE — Telephone Encounter (Signed)
Dr. Ranell Patrick did a phone visit with patient today and she wanted clinical staff to know that patient may come by tomorrow for a UDS and to sign a CSA.

## 2020-10-01 NOTE — Progress Notes (Signed)
Subjective:    Patient ID: Danielle Warren, female    DOB: April 09, 1976, 45 y.o.   MRN: [id number]  HPI  Due to national recommendations of social distancing because of COVID 22, an audio/video tele-health visit is felt to be the most appropriate encounter for this patient at this time. See MyChart message from today for the patient's consent to a tele-health encounter with [facility]. This is a follow up tele-visit via phone. The patient is at home. MD is at office.   [name] is a 45 year old woman who presents for f/u fibromyalgia and lumbar spinal stenosis.   -pain is diffuse- in most muscles and joints -present even when sitting -has had poor quality of life due to pain.  -sleeping poorly due to pain. -takes sleeping pill at doesn't sleep  -she has tried lyrica. -she is taking gabapentin and flexeril -she used to take oxycodone and a patch.  -she has tried extra strength tylenol, bengay, tiger balm cream -she did not find amitriptyline 10mg  helpful -she was on oxycodone QID since she was 45 years old and found that helpful, would like to be restarted on this given her poor quality of life.    Pain Inventory Average Pain 10 Pain Right Now 8 My pain is constant and aching  In the last 24 hours, has pain interfered with the following? General activity 10 Relation with others 9 Enjoyment of life 10 What TIME of day is your pain at its worst? morning , evening and night Sleep (in general) Poor  Pain is worse with: walking, bending, sitting, standing and some activites Pain improves with: rest and medication Relief from Meds: 2     Family History  Problem Relation Age of Onset  . Hypertension Mother   . COPD Mother   . Asthma Mother   . Diabetes Father   . Cancer Father        prostate/pancreatic   . Colon cancer Father   . Colon cancer Sister   . Liver cancer Sister   . Lupus Brother   . Asthma Brother   . Asthma Daughter    . Diabetes Sister   . Sickle cell anemia Brother   . Asthma Daughter    Social History   Socioeconomic History  . Marital status: Legally Separated    Spouse name: Not on file  . Number of children: Not on file  . Years of education: Not on file  . Highest education level: Not on file  Occupational History  . Not on file  Tobacco Use  . Smoking status: Current Every Day Smoker    Packs/day: 0.50    Years: 17.00    Pack years: 8.50    Types: Cigarettes  . Smokeless tobacco: Never Used  . Tobacco comment: patient is aware she needs to quit   Vaping Use  . Vaping Use: Never used  Substance and Sexual Activity  . Alcohol use: Yes    Alcohol/week: 0.0 standard drinks    Comment: 03/20/2014 "maybe once/year I'll have a drink"  . Drug use: No  . Sexual activity: Yes    Partners: Male    Birth control/protection: Surgical  Other Topics Concern  . Not on file  Social History Narrative  . Not on file   Social Determinants of Health   Financial Resource Strain: Not on file  Food Insecurity: Not on file  Transportation Needs: Not on file  Physical Activity: Not on file  Stress: Not on file  Social Connections: Not on file   Past Surgical History:  Procedure Laterality Date  . ABDOMINAL HYSTERECTOMY  10/2012  . CENTRAL VENOUS CATHETER INSERTION Left 1999   chest  . CENTRAL VENOUS CATHETER REMOVAL  06/1997  . CERVICAL CERCLAGE  1997; 1999  . CESAREAN SECTION  1999  . COLONOSCOPY     late 1990's   . DILATION AND CURETTAGE OF UTERUS  1995; 1996   "miscarriages"  . ESOPHAGOGASTRODUODENOSCOPY     late 1990's  . LAPAROSCOPIC CHOLECYSTECTOMY  1997  . LITHOTRIPSY  1990's X 1  . TUBAL LIGATION  1999  . WISDOM TOOTH EXTRACTION  1990's?   "all 4 at one time"   Past Medical History:  Diagnosis Date  . Arthritis    "lower back; right hip; maybe my neck" (03/20/2014)  . Asthma   . Chronic back pain    "inbeween shoulders going up into my neck; lower back" (03/20/2014)  .  Chronic bronchitis ([place])    "get it about q yr" (03/20/2014)  . Degenerative disc disease   . Depression   . Fibromyalgia   . Gallstone   . GERD (gastroesophageal reflux disease)   . History of blood transfusion    "after one of my miscarriages"  . Hyperlipemia   . Hypertension   . Kidney stones   . Migraine    "had them when I was 7 or 45 yrs old; one day after sleep study on CPAP; one today" (03/20/2014)  . Positive TB test   . Type II diabetes mellitus (HCC)    Ht 5\' 3"  (1.6 m)   Wt 169 lb (76.7 kg) Comment: per pt tele visit today  LMP 02/04/2011   BMI 29.94 kg/m   Opioid Risk Score:   Fall Risk Score:  `1  Depression screen PHQ 2/9  Depression screen PHQ 2/9 10/01/2020  Decreased Interest 1  Down, Depressed, Hopeless 1  PHQ - 2 Score 2    Review of Systems  Constitutional: Positive for appetite change and diaphoresis.  HENT: Negative.   Eyes: Negative.   Respiratory: Positive for apnea.   Cardiovascular: Negative.   Gastrointestinal: Positive for diarrhea, nausea and vomiting.  Endocrine: Negative.   Genitourinary: Positive for difficulty urinating.  Musculoskeletal: Positive for arthralgias, back pain, gait problem, myalgias, neck pain and neck stiffness.  Skin: Negative.   Allergic/Immunologic: Negative.   Neurological: Positive for tremors, weakness and numbness.       Tingling   Hematological: Negative.   Psychiatric/Behavioral: Positive for confusion and dysphoric mood.  All other systems reviewed and are negative.      Objective:   Physical Exam Not performed as patient was seen via phone visit     Assessment & Plan:  1) Chronic Pain Syndrome secondary to fibromyalgia and lumbar spinal stenosis -Spinal imaging reviewed with patient -she will come in for UDS and pain contract once her daughter can bring her in. Discussed that if results normal, we can prescribe oxycodone 5mg  BID and gradually increase to her former dose if she needs.  -Discussed  current symptoms of pain and history of pain.  -Discussed benefits of exercise in reducing pain. -Discussed following foods that may reduce pain: 1) Ginger (especially studied for arthritis)- reduce leukotriene production to decrease inflammation 2) Blueberries- high in phytonutrients that decrease inflammation 3) Salmon- marine omega-3s reduce joint swelling and pain 4) Pumpkin seeds- reduce inflammation 5) dark chocolate- reduces inflammation 6) turmeric- reduces inflammation 7) tart cherries -  reduce pain and stiffness 8) extra virgin olive oil - its compound olecanthal helps to block prostaglandins  9) chili peppers- can be eaten or applied topically via capsaicin 10) mint- helpful for headache, muscle aches, joint pain, and itching 11) garlic- reduces inflammation  Link to further information on diet for chronic pain: http://www.randall.com/  2) Insomnia: -increase amitriptyline to 20mg  HS  3) Impaired balance:  -will provide script for a rollator.   10 minutes spent in discussion of her pain and insomnia, discussion of medication options, plan for her to come in to get UDS and sign pain contract, discussion that if results of UDS is normal we can start oxycodone 5mg  BID and gradually increase to her prior dose if she requires, discussed side effects of medication

## 2020-10-10 LAB — TOXASSURE SELECT,+ANTIDEPR,UR

## 2020-10-12 ENCOUNTER — Encounter: Payer: Self-pay | Admitting: *Deleted

## 2020-10-19 ENCOUNTER — Other Ambulatory Visit: Payer: Self-pay | Admitting: Physical Medicine and Rehabilitation

## 2020-10-19 MED ORDER — AMITRIPTYLINE HCL 25 MG PO TABS: 25.0000 mg | ORAL_TABLET | Freq: Every day | ORAL | 3 refills | Status: DC

## 2020-10-19 MED ORDER — GABAPENTIN 600 MG PO TABS: 600.0000 mg | ORAL_TABLET | Freq: Three times a day (TID) | ORAL | 3 refills | Status: DC

## 2020-11-16 ENCOUNTER — Other Ambulatory Visit: Payer: Self-pay | Admitting: Physical Medicine and Rehabilitation

## 2021-01-15 ENCOUNTER — Encounter: Payer: Self-pay | Admitting: Physical Medicine and Rehabilitation

## 2021-01-15 ENCOUNTER — Encounter: Payer: Self-pay | Attending: Physical Medicine and Rehabilitation | Admitting: Physical Medicine and Rehabilitation

## 2021-01-15 ENCOUNTER — Other Ambulatory Visit: Payer: Self-pay

## 2021-01-15 VITALS — BP 166/94 | HR 105 | Ht 63.0 in | Wt 169.0 lb

## 2021-01-15 DIAGNOSIS — M48061 Spinal stenosis, lumbar region without neurogenic claudication: Secondary | ICD-10-CM | POA: Insufficient documentation

## 2021-01-15 DIAGNOSIS — G894 Chronic pain syndrome: Secondary | ICD-10-CM | POA: Insufficient documentation

## 2021-01-15 DIAGNOSIS — M797 Fibromyalgia: Secondary | ICD-10-CM | POA: Insufficient documentation

## 2021-01-15 DIAGNOSIS — Z5181 Encounter for therapeutic drug level monitoring: Secondary | ICD-10-CM | POA: Insufficient documentation

## 2021-01-15 DIAGNOSIS — Z79891 Long term (current) use of opiate analgesic: Secondary | ICD-10-CM | POA: Insufficient documentation

## 2021-01-15 MED ORDER — AMITRIPTYLINE HCL 50 MG PO TABS: 50.0000 mg | ORAL_TABLET | Freq: Every day | ORAL | 1 refills | Status: AC

## 2021-01-15 NOTE — Addendum Note (Signed)
Addended by: Izora Ribas on: 01/15/2021 03:24 PM   Modules accepted: Orders

## 2021-01-15 NOTE — Addendum Note (Signed)
Addended by: Caro Hight on: 01/15/2021 03:28 PM   Modules accepted: Orders

## 2021-01-15 NOTE — Progress Notes (Addendum)
Subjective:    Patient ID: Danielle Warren, female    DOB: 26-May-1975, 45 y.o.   MRN: [id number]  HPI  Danielle Warren is a 45 year old woman who presents for f/u fibromyalgia and lumbar spinal stenosis.   -pain is diffuse- in most muscles and joints -pain is most severe in her neck, shoulders, lower back, hands -heating pads and ice packs help -she has been doing tylenol arthritis -she has tried CBD gummies. She is scared of the lollipops as she is not sure what is in them. She has tried CBD  -present even when sitting -has had poor quality of life due to pain.  -sleeping poorly due to pain. -takes sleeping pill at doesn't sleep  -she has tried lyrica. -she is taking gabapentin and flexeril -she used to take oxycodone and a patch.  -she has tried extra strength tylenol, bengay, tiger balm cream -she did not find amitriptyline '10mg'$  helpful -she was on oxycodone QID since she was 45 years old and found that helpful, would like to be restarted on this given her poor quality of life. We were not able to start this for  -she has peripheral neuropathy from diabetes -she currently has no insurance and is waiting on her disability.    Pain Inventory Average Pain 10 Pain Right Now 8 My pain is constant and aching  In the last 24 hours, has pain interfered with the following? General activity 10 Relation with others 9 Enjoyment of life 10 What TIME of day is your pain at its worst? morning , evening and night Sleep (in general) Poor  Pain is worse with: walking, bending, sitting, standing and some activites Pain improves with: rest and medication Relief from Meds: 2     Family History  Problem Relation Age of Onset   Hypertension Mother    COPD Mother    Asthma Mother    Diabetes Father    Cancer Father        prostate/pancreatic    Colon cancer Father    Colon cancer Sister    Liver cancer Sister    Lupus Brother    Asthma Brother    Asthma Daughter    Diabetes  Sister    Sickle cell anemia Brother    Asthma Daughter    Social History   Socioeconomic History   Marital status: Legally Separated    Spouse name: Not on file   Number of children: Not on file   Years of education: Not on file   Highest education level: Not on file  Occupational History   Not on file  Tobacco Use   Smoking status: Every Day    Packs/day: 0.50    Years: 17.00    Pack years: 8.50    Types: Cigarettes   Smokeless tobacco: Never   Tobacco comments:    patient is aware she needs to quit   Vaping Use   Vaping Use: Never used  Substance and Sexual Activity   Alcohol use: Yes    Alcohol/week: 0.0 standard drinks    Comment: 03/20/2014 "maybe once/year I'll have a drink"   Drug use: No   Sexual activity: Yes    Partners: Male    Birth control/protection: Surgical  Other Topics Concern   Not on file  Social History Narrative   Not on file   Social Determinants of Health   Financial Resource Strain: Not on file  Food Insecurity: Not on file  Transportation Needs: Not on file  Physical Activity: Not on file  Stress: Not on file  Social Connections: Not on file   Past Surgical History:  Procedure Laterality Date   ABDOMINAL HYSTERECTOMY  10/2012   CENTRAL VENOUS CATHETER INSERTION Left 1999   chest   CENTRAL VENOUS CATHETER REMOVAL  06/1997   CERVICAL CERCLAGE  1997; [place]     late 1990's    [place]; 1996   "miscarriages"   ESOPHAGOGASTRODUODENOSCOPY     late 1990's   LAPAROSCOPIC CHOLECYSTECTOMY  1997   LITHOTRIPSY  1990's X 1   TUBAL LIGATION  1999   WISDOM TOOTH EXTRACTION  1990's?   "all 4 at one time"   Past Medical History:  Diagnosis Date   Arthritis    "lower back; right hip; maybe my neck" (03/20/2014)   Asthma    Chronic back pain    "inbeween shoulders going up into my neck; lower back" (03/20/2014)   Chronic bronchitis ([place])    "get it about q yr"  (03/20/2014)   Degenerative disc disease    Depression    Fibromyalgia    Gallstone    GERD (gastroesophageal reflux disease)    History of blood transfusion    "after one of my miscarriages"   Hyperlipemia    Hypertension    Kidney stones    Migraine    "had them when I was 7 or 46 yrs old; one day after sleep study on CPAP; one today" (03/20/2014)   Positive TB test    Type II diabetes mellitus (HCC)    BP (!) 166/94   Pulse (!) 105   Ht '5\' 3"'$  (1.6 m)   Wt 169 lb (76.7 kg)   LMP 02/04/2011   SpO2 98%   BMI 29.94 kg/m   Opioid Risk Score:   Fall Risk Score:  `1  Depression screen PHQ 2/9  Depression screen [facility] 2/9 01/15/2021 10/01/2020  Decreased Interest 1 1  Down, Depressed, Hopeless 1 1  PHQ - 2 Score 2 2    Review of Systems  Constitutional: Positive for appetite change and diaphoresis.  HENT: Negative.   Eyes: Negative.   Respiratory: Positive for apnea.   Cardiovascular: Negative.   Gastrointestinal: Positive for diarrhea, nausea and vomiting.  Endocrine: Negative.   Genitourinary: Positive for difficulty urinating.  Musculoskeletal: Positive for arthralgias, back pain, gait problem, myalgias, neck pain and neck stiffness.  Skin: Negative.   Allergic/Immunologic: Negative.   Neurological: Positive for tremors, weakness and numbness.       Tingling   Hematological: Negative.   Psychiatric/Behavioral: Positive for confusion and dysphoric mood.  All other systems reviewed and are negative.      Objective:   Physical Exam Not performed as patient was seen via phone visit     Assessment & Plan:  1) Chronic Pain Syndrome secondary to fibromyalgia and lumbar spinal stenosis -Spinal imaging reviewed with patient -her original urine sample contained THC so she received first warning letter from clinic. Will repeat sample today and if with expected metabolites, then will prescribe oxycodone '5mg'$  BID.  -Advised not to use CBD oil when taking th oxycodone.   -Discussed surgery as an option -Discussed current symptoms of pain and history of pain.  -Discussed following foods that may reduce pain: 1) Ginger (especially studied for arthritis)- reduce leukotriene production to decrease inflammation 2) Blueberries- high in phytonutrients that decrease inflammation 3) Salmon- marine omega-3s reduce joint  swelling and pain 4) Pumpkin seeds- reduce inflammation 5) dark chocolate- reduces inflammation 6) turmeric- reduces inflammation 7) tart cherries - reduce pain and stiffness 8) extra virgin olive oil - its compound olecanthal helps to block prostaglandins  9) chili peppers- can be eaten or applied topically via capsaicin 10) mint- helpful for headache, muscle aches, joint pain, and itching 11) garlic- reduces inflammation  Link to further information on diet for chronic pain: http://www.randall.com/   Link to further information on diet for chronic pain: http://www.randall.com/  2) Insomnia: Increase amitriptyline to '50mg'$ .   3) Impaired balance:  -will provide script for a rollator.   4) Diabetic peripheral neuropathy: -Discussed Qutenza as an option for neuropathic pain control. Discussed that this is a capsaicin patch, stronger than capsaicin cream. Discussed that it is currently approved for diabetic peripheral neuropathy and post-herpetic neuralgia, but that it has also shown benefit in treating other forms of neuropathy. Provided patient with link to site to learn more about the patch: [employer]fr. Discussed that the patch would be placed in office and benefits usually last 3 months. Discussed that unintended exposure to capsaicin can cause severe irritation of eyes, mucous membranes, respiratory tract, and skin, but that Qutenza is a local treatment and does not have the systemic side effects of  other nerve medications. Discussed that there may be pain, itching, erythema, and decreased sensory function associated with the application of Qutenza. Side effects usually subside within 1 week. A cold pack of analgesic medications can help with these side effects. Blood pressure can also be increased due to pain associated with administration of the patch.   5) Bilateral knee osteoarthritis: -blue emu oil or lidocaine  6) Cervical myofascial pain: May benefit from trigger point injections.

## 2021-01-23 LAB — DRUG TOX MONITOR 1 W/CONF, ORAL FLD
Amphetamines: NEGATIVE ng/mL (ref <10)
Barbiturates: NEGATIVE ng/mL (ref <10)
Benzodiazepines: NEGATIVE ng/mL (ref <0.50)
Buprenorphine: NEGATIVE ng/mL (ref <0.10)
Cocaine: NEGATIVE ng/mL (ref <5.0)
Cotinine: 244.3 ng/mL — ABNORMAL HIGH (ref <5.0)
Fentanyl: NEGATIVE ng/mL (ref <0.10)
Heroin Metabolite: NEGATIVE ng/mL (ref <1.0)
MARIJUANA: NEGATIVE ng/mL (ref <2.5)
MDMA: NEGATIVE ng/mL (ref <10)
Meprobamate: NEGATIVE ng/mL (ref <2.5)
Methadone: NEGATIVE ng/mL (ref <5.0)
Nicotine Metabolite: POSITIVE ng/mL — AB (ref <5.0)
Opiates: NEGATIVE ng/mL (ref <2.5)
Phencyclidine: NEGATIVE ng/mL (ref <10)
Tapentadol: NEGATIVE ng/mL (ref <5.0)
Tramadol: NEGATIVE ng/mL (ref <5.0)
Zolpidem: NEGATIVE ng/mL (ref <5.0)

## 2021-01-23 LAB — DRUG TOX ALC METAB W/CON, ORAL FLD: Alcohol Metabolite: NEGATIVE ng/mL (ref <25)

## 2021-01-25 ENCOUNTER — Telehealth: Payer: Self-pay

## 2021-01-25 ENCOUNTER — Other Ambulatory Visit: Payer: Self-pay | Admitting: Physical Medicine and Rehabilitation

## 2021-01-25 MED ORDER — OXYCODONE HCL 5 MG PO TABS: 5.0000 mg | ORAL_TABLET | Freq: Two times a day (BID) | ORAL | 0 refills | Status: AC | PRN

## 2021-01-25 NOTE — Telephone Encounter (Signed)
Patient called stating that she can see that her drug screen results came in and she is requesting pain medication.

## 2021-01-25 NOTE — Telephone Encounter (Signed)
Pt.notified

## 2021-02-01 ENCOUNTER — Telehealth: Payer: Self-pay | Admitting: *Deleted

## 2021-02-01 NOTE — Telephone Encounter (Signed)
Oral swab drug screen was consistent for no prescribed or unprescribed controlled medications.  

## 2021-02-10 ENCOUNTER — Encounter: Payer: Self-pay | Admitting: Registered Nurse

## 2021-03-09 ENCOUNTER — Telehealth: Payer: Self-pay

## 2021-03-09 NOTE — Telephone Encounter (Signed)
Patient called on 05/09/2020 and left a message stating her phone is not working. She can see her Mychart messages.    Patient has requested medication refills. However she did not state which ones. Message has been created in her chart.

## 2021-03-10 ENCOUNTER — Other Ambulatory Visit: Payer: Self-pay

## 2021-03-10 ENCOUNTER — Encounter: Payer: Self-pay | Admitting: Registered Nurse

## 2021-03-10 MED ORDER — GABAPENTIN 600 MG PO TABS: 600.0000 mg | ORAL_TABLET | Freq: Three times a day (TID) | ORAL | 3 refills | Status: AC

## 2021-03-10 NOTE — Telephone Encounter (Signed)
Danielle Warren's phone is not in working order. However she can receive Mychart messages.  Patient requesting medication refills.   Patient has not been in because she has no insurance. But is working on getting assistance.

## 2021-04-16 ENCOUNTER — Encounter: Payer: Self-pay | Attending: Physical Medicine and Rehabilitation | Admitting: Physical Medicine and Rehabilitation

## 2021-04-16 DIAGNOSIS — Z5181 Encounter for therapeutic drug level monitoring: Secondary | ICD-10-CM | POA: Insufficient documentation

## 2021-04-16 DIAGNOSIS — Z79891 Long term (current) use of opiate analgesic: Secondary | ICD-10-CM | POA: Insufficient documentation

## 2021-04-16 DIAGNOSIS — G894 Chronic pain syndrome: Secondary | ICD-10-CM | POA: Insufficient documentation

## 2021-04-16 DIAGNOSIS — M797 Fibromyalgia: Secondary | ICD-10-CM | POA: Insufficient documentation

## 2021-04-16 DIAGNOSIS — M48061 Spinal stenosis, lumbar region without neurogenic claudication: Secondary | ICD-10-CM | POA: Insufficient documentation

## 2021-10-08 IMAGING — CR DG LUMBAR SPINE COMPLETE 4+V
5 series · 5 of 5 positions shown · non-contrast
Comparison: None.

CLINICAL DATA: Chronic back pain

EXAM:
LUMBAR SPINE - COMPLETE 4+ VIEW

[t l-spine a.p.]
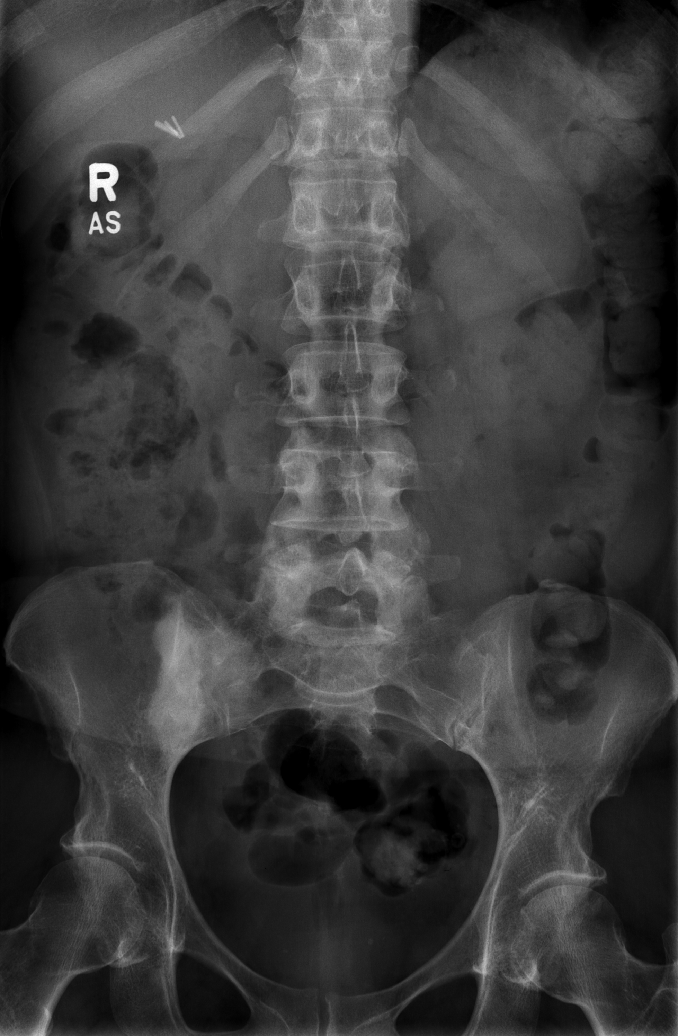

[t l-spine oblique exposure (1 of 2)]
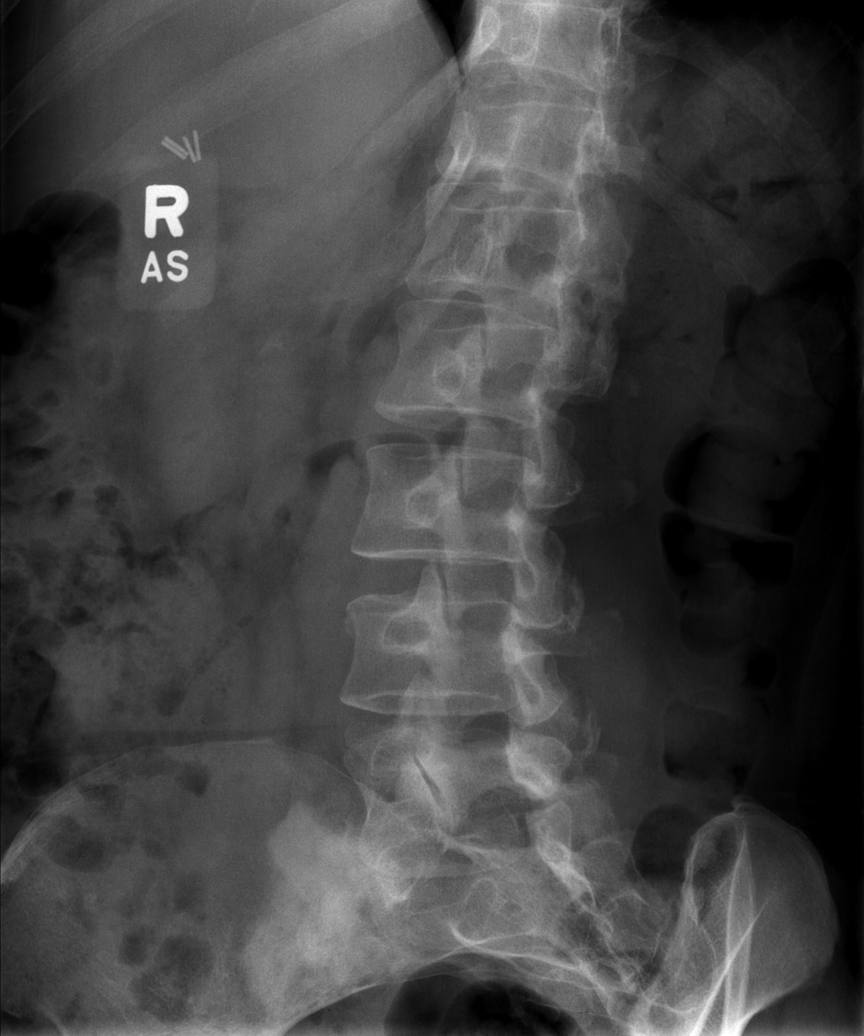

[t l-spine oblique exposure (2 of 2)]
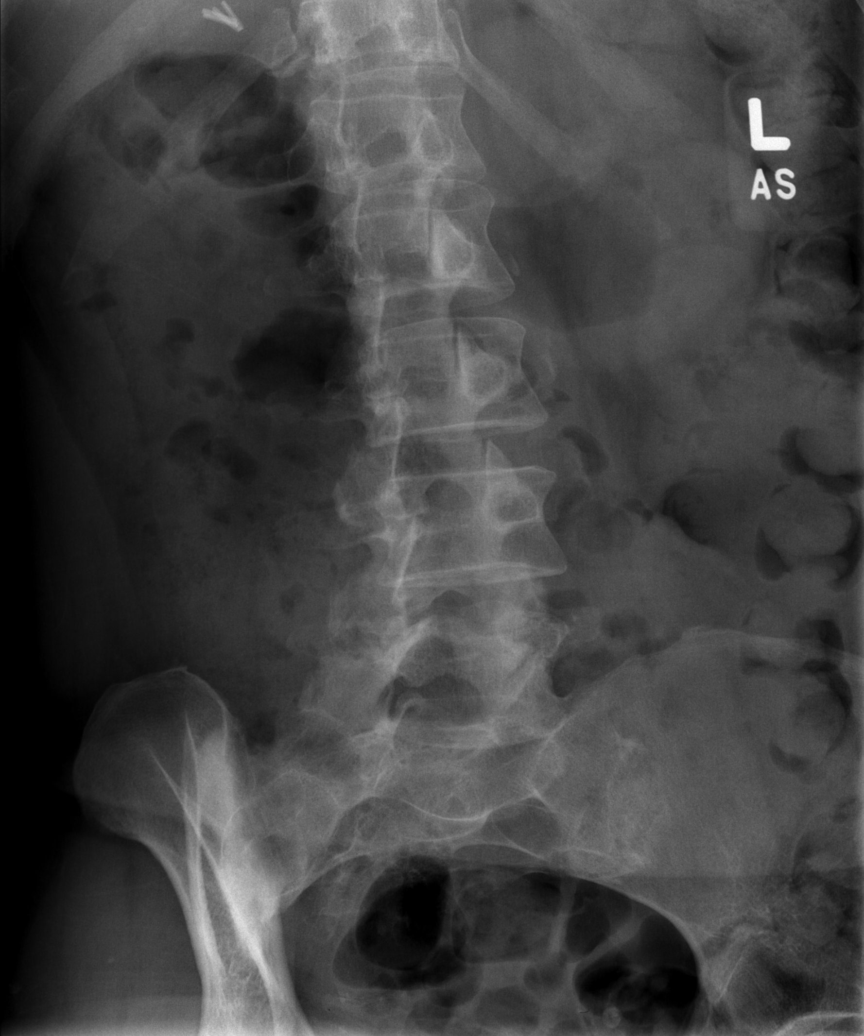

[t l-spine lat]
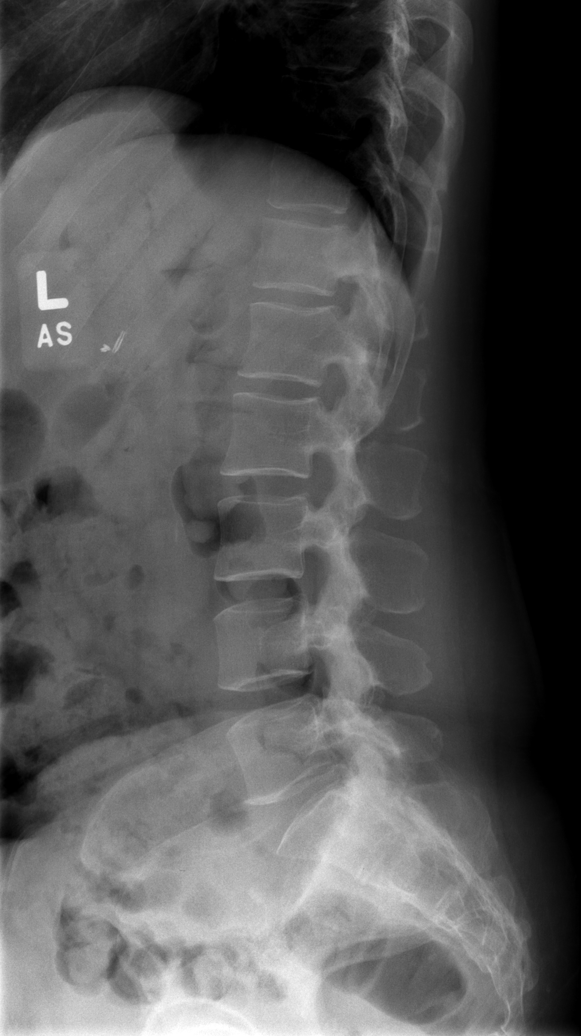

[t l-spine l5-s1 spot]
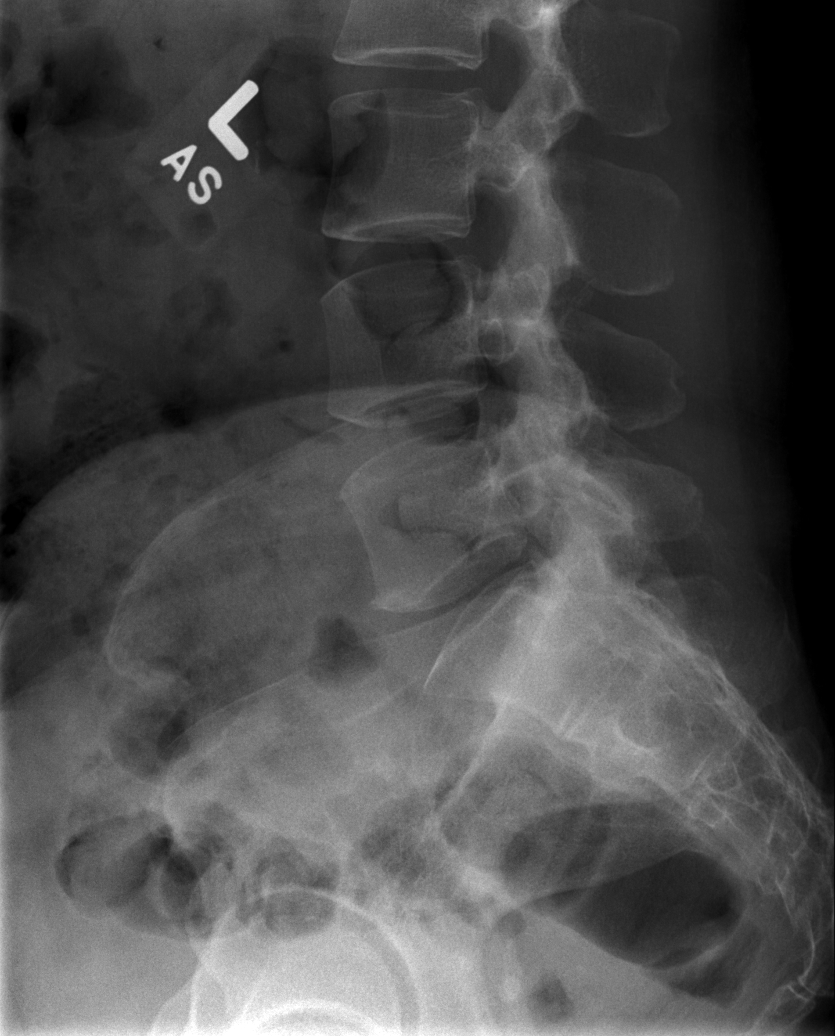

[5 of 5 positions shown; findings below may reference images not displayed]

FINDINGS: Lumbar vertebral body heights and alignment are maintained. There is
no substantial disc space narrowing. Facet hypertrophy at L5-S1.
High density along the right sacroiliac joint probably reflects
sacroplasty. Bowel gas pattern is unremarkable. Cholecystectomy
clips are noted.
IMPRESSION: Facet degeneration at L5-S1.

## 2021-11-15 ENCOUNTER — Other Ambulatory Visit: Payer: Self-pay | Admitting: Physical Medicine and Rehabilitation

## 2022-05-27 ENCOUNTER — Other Ambulatory Visit: Payer: Self-pay

## 2022-05-27 ENCOUNTER — Emergency Department (HOSPITAL_BASED_OUTPATIENT_CLINIC_OR_DEPARTMENT_OTHER)
Admission: EM | Admit: 2022-05-27 | Discharge: 2022-05-27 | Disposition: A | Discharge status: Home / Self Care | Payer: Medicaid Other | Attending: Emergency Medicine | Admitting: Emergency Medicine

## 2022-05-27 DIAGNOSIS — Z79899 Other long term (current) drug therapy: Secondary | ICD-10-CM | POA: Insufficient documentation

## 2022-05-27 DIAGNOSIS — K047 Periapical abscess without sinus: Secondary | ICD-10-CM | POA: Insufficient documentation

## 2022-05-27 DIAGNOSIS — K0889 Other specified disorders of teeth and supporting structures: Secondary | ICD-10-CM | POA: Diagnosis present

## 2022-05-27 DIAGNOSIS — I10 Essential (primary) hypertension: Secondary | ICD-10-CM | POA: Insufficient documentation

## 2022-05-27 MED ORDER — OXYCODONE-ACETAMINOPHEN 5-325 MG PO TABS: 1.0000 | ORAL_TABLET | Freq: Four times a day (QID) | ORAL | 0 refills | Status: AC | PRN

## 2022-05-27 MED ORDER — CLINDAMYCIN HCL 150 MG PO CAPS: 450.0000 mg | ORAL_CAPSULE | Freq: Three times a day (TID) | ORAL | 0 refills | Status: AC

## 2022-05-27 MED ORDER — CLINDAMYCIN HCL 150 MG PO CAPS
450.0000 mg | ORAL_CAPSULE | Freq: Once | ORAL | Status: AC
  Administered 2022-05-27: 450 mg via ORAL
  Filled 2022-05-27: qty 3

## 2022-05-27 MED ORDER — LOSARTAN POTASSIUM-HCTZ 50-12.5 MG PO TABS: 1.0000 | ORAL_TABLET | Freq: Every day | ORAL | 0 refills | Status: AC

## 2022-05-27 MED ORDER — OXYCODONE-ACETAMINOPHEN 5-325 MG PO TABS
2.0000 | ORAL_TABLET | Freq: Once | ORAL | Status: AC
  Administered 2022-05-27: 2 via ORAL
  Filled 2022-05-27: qty 2

## 2022-05-27 NOTE — ED Triage Notes (Signed)
Patient presents to ED via POV from home. Here with right sided dental pain and right ear pain. Reports dentist told her she has "3 bad teeth". "The dentist wouldn't prescribe me any medicine because of all my medical conditions".

## 2022-05-27 NOTE — Discharge Instructions (Addendum)
You were seen in the emergency department for a dental abscess. Given that you appear to have a sizeable abscess on ultrasound, treating with the proper antibiotics is recommended with follow up with dentist for further treatment. I have sent in a prescription for Clindamycin and Percocet for you to take until you are able to see a dentist for follow up.   Attached is the information for Dr. Haig Prophet (dentist) that you should reach out to for evaluation.

## 2022-05-27 NOTE — ED Notes (Addendum)
Per pt she indicated that the "dentist will not treat me b/c of my medical conditions."  Pt states the pain is unbearable.  Pt started three weeks ago. She had been treated w/ antibiotics over three weeks and went to the Dentist today as she was told to.  "Dentist told me that I need to get medical clearance for medications and for surgery so she told me to come to the ED."   "Now that I have Medicaid I can't go to the wellness clinic, I have to find a new Dr. To take care of me."

## 2022-05-27 NOTE — ED Provider Notes (Signed)
[facility] [place] [facility] Provider Note   CSN: [id number] Arrival date & time: 05/27/22  1619     History Chief Complaint  Patient presents with   Dental Pain    Danielle Warren is a 47 y.o. female.   Dental Pain Associated symptoms: no fever   Patient presents emergency department complaints of right-sided dental pain.  Patient reports she was at the dentist earlier today and was told that she could not have antibiotics prescribed due to her complex medical history was advised to come to the emergency department for further treatment.  Patient reports that dentist was not willing to perform incision and drainage and was told that there is a sizable abscess on the right side of her jaw.  Patient denies any fevers, reports significant pain.     Home Medications Prior to Admission medications   Medication Sig Start Date End Date Taking? Authorizing Provider  clindamycin (CLEOCIN) 150 MG capsule Take 3 capsules (450 mg total) by mouth every 8 (eight) hours for 7 days. 05/27/22 06/03/22 Yes Luvenia Heller, PA-C  losartan-hydrochlorothiazide (HYZAAR) 50-12.5 MG tablet Take 1 tablet by mouth daily. 05/27/22 06/26/22 Yes Luvenia Heller, PA-C  oxyCODONE-acetaminophen (PERCOCET/ROXICET) 5-325 MG tablet Take 1 tablet by mouth every 6 (six) hours as needed for severe pain. 05/27/22  Yes Lourdes Sledge A, PA-C  albuterol (VENTOLIN HFA) 108 (90 Base) MCG/ACT inhaler Inhale into the lungs. Patient not taking: Reported on 01/15/2021    [provider]  amitriptyline (ELAVIL) 50 MG tablet Take 1 tablet (50 mg total) by mouth at bedtime. 01/15/21   Raulkar, Clide Deutscher, MD  amLODipine (NORVASC) 10 MG tablet Take 10 mg by mouth daily.  Patient not taking: Reported on 01/15/2021 09/03/19   [provider]  atorvastatin (LIPITOR) 40 MG tablet Take 40 mg by mouth at bedtime. Patient not taking: Reported on 01/15/2021 12/04/19   [provider]  BD VEO  INSULIN SYRINGE U/F 31G X 15/64" 0.5 ML MISC  12/11/19   [provider]  cetirizine (ZYRTEC) 10 MG tablet Take 10 mg by mouth daily. Patient not taking: Reported on 01/15/2021 11/28/19   [provider]  Continuous Blood Gluc Receiver (FREESTYLE LIBRE 14 DAY READER) DEVI SMARTSIG:1 Topical Every Night Patient not taking: Reported on 01/15/2021 01/10/20   [provider]  Continuous Blood Gluc Sensor (FREESTYLE LIBRE 14 DAY SENSOR) MISC Sensor on Left arm Patient not taking: Reported on 01/15/2021 01/10/20   [provider]  cyclobenzaprine (FLEXERIL) 10 MG tablet SMARTSIG:1 Tablet(s) By Mouth 1 to 3 Times Daily Patient not taking: Reported on 01/15/2021 10/24/19   [provider]  diphenhydrAMINE (BENADRYL) 25 mg capsule Take 1 capsule (25 mg total) by mouth every 6 (six) hours as needed (headache along with compazine). Patient not taking: Reported on 01/15/2021 03/21/14   Jonetta Osgood, MD  DULoxetine (CYMBALTA) 30 MG capsule Take 30 mg by mouth 2 (two) times daily.  Patient not taking: Reported on 01/15/2021 12/05/19   [provider]  FLECTOR 1.3 % PTCH Place 1 patch onto the skin as needed.  Patient not taking: Reported on 01/15/2021 11/04/19   [provider]  fluconazole (DIFLUCAN) 150 MG tablet Take 150 mg by mouth as needed.  Patient not taking: Reported on 01/15/2021 06/30/16   [provider]  gabapentin (NEURONTIN) 600 MG tablet Take 1 tablet (600 mg total) by mouth 3 (three) times daily. 03/10/21   Raulkar, Clide Deutscher, MD  glipiZIDE (GLUCOTROL) 10 MG tablet Take 20-30 mg by mouth daily.  Patient not taking: Reported on 01/15/2021 09/19/19   [provider]  hyoscyamine (LEVSIN SL) 0.125 MG SL tablet Place 1 tablet (0.125 mg total) under the tongue every 4 (four) hours as needed. Patient not taking: Reported on 01/15/2021 01/28/20   Cirigliano, Vito V, DO  LEVEMIR 100 UNIT/ML injection Inject into the skin. 17units in the  morning and 14 units at night Patient not taking: Reported on 01/15/2021 12/04/19   [provider]  losartan-hydrochlorothiazide (HYZAAR) 100-12.5 MG tablet Take 1 tablet by mouth daily. Patient not taking: Reported on 01/15/2021 07/02/20   [provider]  metoprolol tartrate (LOPRESSOR) 25 MG tablet Take 1 tablet (25 mg total) by mouth 2 (two) times daily. 01/09/20 10/01/20  Pixie Casino, MD  mirtazapine (REMERON) 15 MG tablet Take 7.5 mg by mouth daily. Patient not taking: Reported on 01/15/2021 12/19/19   [provider]  mupirocin ointment (BACTROBAN) 2 % SMARTSIG:Sparingly Topical 3 Times Daily PRN Patient not taking: Reported on 01/15/2021 12/04/19   [provider]  naloxone Karma Greaser) 4 MG/0.1ML LIQD nasal spray kit  09/25/19   [provider]  omeprazole (PRILOSEC) 20 MG capsule Take 1 capsule (20 mg total) by mouth 2 (two) times daily before a meal. After 4 weeks may decrease to 20 mg  once a day 30 min prior to eating. Patient not taking: Reported on 01/15/2021 02/10/20   Cirigliano, Vito V, DO  ondansetron (ZOFRAN) 4 MG tablet Take 4 mg by mouth as needed.  Patient not taking: Reported on 01/15/2021 08/15/19   [provider]  oxyCODONE (ROXICODONE) 5 MG immediate release tablet Take 1 tablet (5 mg total) by mouth 2 (two) times daily as needed for severe pain. 01/25/21   Raulkar, Clide Deutscher, MD  OZEMPIC, 0.25 OR 0.5 MG/DOSE, 2 MG/1.5ML SOPN Inject into the skin once a week.  Patient not taking: Reported on 01/15/2021 12/13/19   [provider]  PHENADOZ 25 MG suppository INSERT 1 SUPPOSITORY RECTALLY EVERY 6 HOURS AS NEEDED FOR 10 DAYS Patient not taking: Reported on 01/15/2021 11/21/19   [provider]      Allergies    Amoxicillin-pot clavulanate, Cephalosporins, Doxycycline, Macrolides and ketolides, Nitrofurantoin, Penicillins, Sulfa antibiotics, Sulfamethoxazole-trimethoprim, Sulfasalazine, Amoxicillin, Elemental sulfur, and  Telbivudine    Review of Systems   Review of Systems  Constitutional:  Negative for chills and fever.  HENT:  Positive for dental problem.   Respiratory:  Negative for cough.   Cardiovascular:  Negative for chest pain.  All other systems reviewed and are negative.   Physical Exam Updated Vital Signs BP (!) 173/86   Pulse 99   Temp 98.4 F (36.9 C) (Oral)   Resp 17   Ht '5\' 1"'$  (1.549 m)   Wt 75.8 kg   LMP 02/04/2011   SpO2 100%   BMI 31.55 kg/m  Physical Exam Vitals and nursing note reviewed.  Constitutional:      General: She is not in acute distress.    Appearance: Normal appearance. She is not ill-appearing.  HENT:     Head: Normocephalic and atraumatic.     Nose: Nose normal.     Mouth/Throat:     Comments: No noted drainage on oral exam, but swelling and tenderness to left upper and lower posterior molars Eyes:     Conjunctiva/sclera: Conjunctivae normal.  Skin:    Capillary Refill: Capillary refill takes less than 2 seconds.  Neurological:  General: No focal deficit present.     Mental Status: She is alert.     ED Results / Procedures / Treatments   Labs (all labs ordered are listed, but only abnormal results are displayed) Labs Reviewed - No data to display  EKG None  Radiology No results found.  Procedures Procedures   Medications Ordered in ED Medications  clindamycin (CLEOCIN) capsule 450 mg (450 mg Oral Given 05/27/22 2047)  oxyCODONE-acetaminophen (PERCOCET/ROXICET) 5-325 MG per tablet 2 tablet (2 tablets Oral Given 05/27/22 2048)    ED Course/ Medical Decision Making/ A&P                           Medical Decision Making Risk Prescription drug management.   This patient presents to the ED for concern of dental abscess.  Differential diagnosis includes dental caries, dental abscess, peritonsillar abscess   Medicines ordered and prescription drug management:  I ordered medication including Percocet, Clindamycin for pain,  infection Reevaluation of the patient after these medicines showed that the patient improved I have reviewed the patients home medicines and have made adjustments as needed   Problem List / ED Course:  Patient presented to the ED for concerns of dental abscess. Was seen by dentist earlier today, but advised to present to ED due to concerns about multiple medical problems and unwilling to treat infection. Given presentation and abscess confirmed on [place], advised patient that she should have correct antibiotics prescribed as Azithromycin would likely not resolve dental infection. Patient verbalized understanding need for new antibiotic. Also renewed patient's HTN medication to reduce chance of dentist declining to treat due to HTN. Patient was agreeable with plan to discharge home and follow up with dentist for further evaluation.   Final Clinical Impression(s) / ED Diagnoses Final diagnoses:  Dental abscess    Rx / DC Orders ED Discharge Orders          Ordered    losartan-hydrochlorothiazide (HYZAAR) 50-12.5 MG tablet  Daily        05/27/22 2053    oxyCODONE-acetaminophen (PERCOCET/ROXICET) 5-325 MG tablet  Every 6 hours PRN        05/27/22 2053    clindamycin (CLEOCIN) 150 MG capsule  Every 8 hours        05/27/22 2053              Luvenia Heller, PA-C 05/27/22 2320    Drenda Freeze, MD 05/27/22 [phone]

## 2022-06-24 ENCOUNTER — Encounter (HOSPITAL_BASED_OUTPATIENT_CLINIC_OR_DEPARTMENT_OTHER): Payer: Self-pay | Admitting: Urology

## 2022-06-24 ENCOUNTER — Emergency Department (HOSPITAL_BASED_OUTPATIENT_CLINIC_OR_DEPARTMENT_OTHER)
Admission: EM | Admit: 2022-06-24 | Discharge: 2022-06-24 | Disposition: A | Discharge status: Home / Self Care | Payer: Medicaid Other | Attending: Emergency Medicine | Admitting: Emergency Medicine

## 2022-06-24 DIAGNOSIS — R000 Tachycardia, unspecified: Secondary | ICD-10-CM | POA: Insufficient documentation

## 2022-06-24 DIAGNOSIS — R059 Cough, unspecified: Secondary | ICD-10-CM | POA: Insufficient documentation

## 2022-06-24 DIAGNOSIS — Z1152 Encounter for screening for COVID-19: Secondary | ICD-10-CM | POA: Insufficient documentation

## 2022-06-24 DIAGNOSIS — R0789 Other chest pain: Secondary | ICD-10-CM | POA: Insufficient documentation

## 2022-06-24 DIAGNOSIS — K0889 Other specified disorders of teeth and supporting structures: Secondary | ICD-10-CM

## 2022-06-24 DIAGNOSIS — R6889 Other general symptoms and signs: Secondary | ICD-10-CM

## 2022-06-24 LAB — RESP PANEL BY RT-PCR (RSV, FLU A&B, COVID)  RVPGX2
Influenza A by PCR: NEGATIVE
Influenza B by PCR: NEGATIVE
Resp Syncytial Virus by PCR: NEGATIVE
SARS Coronavirus 2 by RT PCR: NEGATIVE

## 2022-06-24 MED ORDER — HYDROCODONE-ACETAMINOPHEN 5-325 MG PO TABS: 1.0000 | ORAL_TABLET | Freq: Four times a day (QID) | ORAL | 0 refills | Status: AC | PRN

## 2022-06-24 MED ORDER — HYDROCODONE-ACETAMINOPHEN 5-325 MG PO TABS
2.0000 | ORAL_TABLET | Freq: Once | ORAL | Status: AC
  Administered 2022-06-24: 2 via ORAL
  Filled 2022-06-24: qty 2

## 2022-06-24 NOTE — ED Triage Notes (Signed)
Pt had teeth taken out 2 days ago  States only given 5 pain pill and is in a lot of pain States headache, diarreha and sneezing that started yesterday  Pt states needs something for dental pain

## 2022-06-24 NOTE — Discharge Instructions (Signed)
You have been seen by your caregiver because of dental pain.  SEEK MEDICAL ATTENTION IF: The exam and treatment you received today has been provided on an emergency basis only. This is not a substitute for complete medical or dental care. If your problem worsens or new symptoms (problems) appear, and you are unable to arrange prompt follow-up care with your dentist, call or return to this location. CALL YOUR DENTIST OR RETURN IMMEDIATELY IF you develop a fever, rash, difficulty breathing or swallowing, neck or facial swelling, or other potentially serious concerns.

## 2022-06-24 NOTE — ED Provider Notes (Signed)
[facility] Provider Note   CSN: [id number] Arrival date & time: 06/24/22  1244     History  Chief Complaint  Patient presents with   Dental Problem    Danielle Warren is a 47 y.o. female who presents emergency department with chief complaint of dental pain.  She had all of her upper teeth pulled 5 days ago by oral maxillofacial surgery.  She was given 5 narcotic tablets and naproxen but she states that she is unable to take anti-inflammatories due to history of stomach issues.  She is in severe pain.  She also woke up with sweats and runny nose along with cough.  She does not know if she was running a fever but thinks she might "have COVID."  She is already taking antibiotics  HPI     Home Medications Prior to Admission medications   Medication Sig Start Date End Date Taking? Authorizing Provider  albuterol (VENTOLIN HFA) 108 (90 Base) MCG/ACT inhaler Inhale into the lungs. Patient not taking: Reported on 01/15/2021    [provider]  amitriptyline (ELAVIL) 50 MG tablet Take 1 tablet (50 mg total) by mouth at bedtime. 01/15/21   Raulkar, Clide Deutscher, MD  amLODipine (NORVASC) 10 MG tablet Take 10 mg by mouth daily.  Patient not taking: Reported on 01/15/2021 09/03/19   [provider]  atorvastatin (LIPITOR) 40 MG tablet Take 40 mg by mouth at bedtime. Patient not taking: Reported on 01/15/2021 12/04/19   [provider]  BD VEO INSULIN SYRINGE U/F 31G X 15/64" 0.5 ML MISC  12/11/19   [provider]  cetirizine (ZYRTEC) 10 MG tablet Take 10 mg by mouth daily. Patient not taking: Reported on 01/15/2021 11/28/19   [provider]  Continuous Blood Gluc Receiver (FREESTYLE LIBRE 14 DAY READER) DEVI SMARTSIG:1 Topical Every Night Patient not taking: Reported on 01/15/2021 01/10/20   [provider]  Continuous Blood Gluc Sensor (FREESTYLE LIBRE 14 DAY SENSOR) MISC Sensor on Left arm Patient not  taking: Reported on 01/15/2021 01/10/20   [provider]  cyclobenzaprine (FLEXERIL) 10 MG tablet SMARTSIG:1 Tablet(s) By Mouth 1 to 3 Times Daily Patient not taking: Reported on 01/15/2021 10/24/19   [provider]  diphenhydrAMINE (BENADRYL) 25 mg capsule Take 1 capsule (25 mg total) by mouth every 6 (six) hours as needed (headache along with compazine). Patient not taking: Reported on 01/15/2021 03/21/14   Jonetta Osgood, MD  DULoxetine (CYMBALTA) 30 MG capsule Take 30 mg by mouth 2 (two) times daily.  Patient not taking: Reported on 01/15/2021 12/05/19   [provider]  FLECTOR 1.3 % PTCH Place 1 patch onto the skin as needed.  Patient not taking: Reported on 01/15/2021 11/04/19   [provider]  fluconazole (DIFLUCAN) 150 MG tablet Take 150 mg by mouth as needed.  Patient not taking: Reported on 01/15/2021 06/30/16   [provider]  gabapentin (NEURONTIN) 600 MG tablet Take 1 tablet (600 mg total) by mouth 3 (three) times daily. 03/10/21   Raulkar, Clide Deutscher, MD  glipiZIDE (GLUCOTROL) 10 MG tablet Take 20-30 mg by mouth daily.  Patient not taking: Reported on 01/15/2021 09/19/19   [provider]  hyoscyamine (LEVSIN SL) 0.125 MG SL tablet Place 1 tablet (0.125 mg total) under the tongue every 4 (four) hours as needed. Patient not taking: Reported on 01/15/2021 01/28/20   Cirigliano, Vito V, DO  LEVEMIR 100 UNIT/ML injection Inject into the skin. 17units  in the morning and 14 units at night Patient not taking: Reported on 01/15/2021 12/04/19   [provider]  losartan-hydrochlorothiazide (HYZAAR) 100-12.5 MG tablet Take 1 tablet by mouth daily. Patient not taking: Reported on 01/15/2021 07/02/20   [provider]  losartan-hydrochlorothiazide (HYZAAR) 50-12.5 MG tablet Take 1 tablet by mouth daily. 05/27/22 06/26/22  Luvenia Heller, PA-C  metoprolol tartrate (LOPRESSOR) 25 MG tablet Take 1 tablet (25 mg total) by mouth 2 (two) times  daily. 01/09/20 10/01/20  Pixie Casino, MD  mirtazapine (REMERON) 15 MG tablet Take 7.5 mg by mouth daily. Patient not taking: Reported on 01/15/2021 12/19/19   [provider]  mupirocin ointment (BACTROBAN) 2 % SMARTSIG:Sparingly Topical 3 Times Daily PRN Patient not taking: Reported on 01/15/2021 12/04/19   [provider]  naloxone Karma Greaser) 4 MG/0.1ML LIQD nasal spray kit  09/25/19   [provider]  omeprazole (PRILOSEC) 20 MG capsule Take 1 capsule (20 mg total) by mouth 2 (two) times daily before a meal. After 4 weeks may decrease to 20 mg  once a day 30 min prior to eating. Patient not taking: Reported on 01/15/2021 02/10/20   Cirigliano, Vito V, DO  ondansetron (ZOFRAN) 4 MG tablet Take 4 mg by mouth as needed.  Patient not taking: Reported on 01/15/2021 08/15/19   [provider]  oxyCODONE (ROXICODONE) 5 MG immediate release tablet Take 1 tablet (5 mg total) by mouth 2 (two) times daily as needed for severe pain. 01/25/21   Raulkar, Clide Deutscher, MD  oxyCODONE-acetaminophen (PERCOCET/ROXICET) 5-325 MG tablet Take 1 tablet by mouth every 6 (six) hours as needed for severe pain. 05/27/22   Lourdes Sledge A, PA-C  OZEMPIC, 0.25 OR 0.5 MG/DOSE, 2 MG/1.5ML SOPN Inject into the skin once a week.  Patient not taking: Reported on 01/15/2021 12/13/19   [provider]  PHENADOZ 25 MG suppository INSERT 1 SUPPOSITORY RECTALLY EVERY 6 HOURS AS NEEDED FOR 10 DAYS Patient not taking: Reported on 01/15/2021 11/21/19   [provider]      Allergies    Amoxicillin-pot clavulanate, Cephalosporins, Doxycycline, Macrolides and ketolides, Nitrofurantoin, Penicillins, Sulfa antibiotics, Sulfamethoxazole-trimethoprim, Sulfasalazine, Amoxicillin, Elemental sulfur, and Telbivudine    Review of Systems   Review of Systems  Physical Exam Updated Vital Signs BP (!) 166/94 (BP Location: Left Arm)   Pulse (!) 120   Temp 98.5 F (36.9 C) (Oral)   Resp 18   Ht '5\' 1"'$   (1.549 m)   Wt 75.8 kg   LMP 02/04/2011   SpO2 100%   BMI 31.57 kg/m  Physical Exam Vitals and nursing note reviewed.  Constitutional:      General: She is not in acute distress.    Appearance: She is well-developed. She is not diaphoretic.  HENT:     Head: Normocephalic and atraumatic.     Right Ear: External ear normal.     Left Ear: External ear normal.     Nose: Nose normal.     Mouth/Throat:     Mouth: Mucous membranes are moist.  Eyes:     General: No scleral icterus.    Conjunctiva/sclera: Conjunctivae normal.  Cardiovascular:     Rate and Rhythm: Regular rhythm. Tachycardia present.     Heart sounds: Normal heart sounds. No murmur heard.    No friction rub. No gallop.  Pulmonary:     Effort: Pulmonary effort is normal. No respiratory distress.     Breath sounds: Normal breath sounds.  Chest:  Chest wall: Tenderness present.  Abdominal:     General: Bowel sounds are normal. There is no distension.     Palpations: Abdomen is soft. There is no mass.     Tenderness: There is no abdominal tenderness. There is no guarding.  Musculoskeletal:     Cervical back: Normal range of motion.  Skin:    General: Skin is warm and dry.  Neurological:     Mental Status: She is alert and oriented to person, place, and time.  Psychiatric:        Behavior: Behavior normal.     ED Results / Procedures / Treatments   Labs (all labs ordered are listed, but only abnormal results are displayed) Labs Reviewed - No data to display  EKG None  Radiology No results found.  Procedures Procedures    Medications Ordered in ED Medications - No data to display  ED Course/ Medical Decision Making/ A&P                             Medical Decision Making Patient here with dental pain, no obvious signs of infection.  She is requesting a COVID swab.  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  Will refill her pain medication.  No evidence of Ludwig's angina or severe infection.  She is advised to follow closely with her oral maxillofacial surgeon.           Final Clinical Impression(s) / ED Diagnoses Final diagnoses:  None    Rx / DC Orders ED Discharge Orders     None         Margarita Mail, PA-C 06/24/22 1546    Wyvonnia Dusky, MD 06/24/22 1714

## 2022-08-15 ENCOUNTER — Encounter: Payer: Self-pay | Admitting: *Deleted

## 2023-08-02 ENCOUNTER — Other Ambulatory Visit (HOSPITAL_BASED_OUTPATIENT_CLINIC_OR_DEPARTMENT_OTHER): Payer: Self-pay

## 2023-08-02 MED ORDER — OXYCODONE-ACETAMINOPHEN 10-325 MG PO TABS
1.0000 | ORAL_TABLET | Freq: Every day | ORAL | 0 refills | Status: DC
  Filled 2023-08-02: qty 150, 30d supply, fill #0

## 2023-08-03 ENCOUNTER — Other Ambulatory Visit (HOSPITAL_BASED_OUTPATIENT_CLINIC_OR_DEPARTMENT_OTHER): Payer: Self-pay

## 2023-08-08 ENCOUNTER — Other Ambulatory Visit (HOSPITAL_BASED_OUTPATIENT_CLINIC_OR_DEPARTMENT_OTHER): Payer: Self-pay

## 2023-08-08 MED ORDER — DICLOFENAC SODIUM 1 % EX GEL
2.0000 g | CUTANEOUS | 3 refills | Status: AC
  Filled 2023-08-18 – 2023-09-09 (×2): qty 100, 25d supply, fill #0

## 2023-08-08 MED ORDER — INSULIN GLARGINE 100 UNIT/ML SOLOSTAR PEN
20.0000 [IU] | PEN_INJECTOR | Freq: Two times a day (BID) | SUBCUTANEOUS | 6 refills | Status: AC
  Filled 2023-09-09 – 2023-10-06 (×2): qty 15, 38d supply, fill #0

## 2023-08-08 MED ORDER — MOUNJARO 7.5 MG/0.5ML ~~LOC~~ SOAJ
7.5000 mg | SUBCUTANEOUS | 6 refills | Status: AC
  Filled 2023-08-17: qty 2, 28d supply, fill #0
  Filled 2023-09-04 – 2024-01-30 (×4): qty 2, 28d supply, fill #1

## 2023-08-08 MED ORDER — FLUCONAZOLE 150 MG PO TABS
150.0000 mg | ORAL_TABLET | Freq: Every day | ORAL | 1 refills | Status: AC
  Filled 2023-08-14 – 2023-08-16 (×4): qty 3, 3d supply, fill #0
  Filled 2023-09-09 – 2023-10-06 (×2): qty 3, 3d supply, fill #1

## 2023-08-08 MED ORDER — MONTELUKAST SODIUM 10 MG PO TABS
10.0000 mg | ORAL_TABLET | Freq: Every day | ORAL | 3 refills | Status: AC
  Filled 2023-08-14 – 2023-08-16 (×5): qty 30, 30d supply, fill #0
  Filled 2023-09-04 – 2023-09-19 (×2): qty 30, 30d supply, fill #1
  Filled ????-??-??: fill #1

## 2023-08-08 MED ORDER — PREGABALIN 150 MG PO CAPS
150.0000 mg | ORAL_CAPSULE | Freq: Two times a day (BID) | ORAL | 5 refills | Status: AC
  Filled 2023-08-21 – 2024-04-18 (×2): qty 60, 30d supply, fill #0

## 2023-08-08 MED ORDER — PANTOPRAZOLE SODIUM 40 MG PO TBEC
40.0000 mg | DELAYED_RELEASE_TABLET | Freq: Two times a day (BID) | ORAL | 3 refills | Status: DC
  Filled 2023-08-14 – 2023-08-16 (×5): qty 60, 30d supply, fill #0

## 2023-08-08 MED ORDER — ONDANSETRON HCL 4 MG PO TABS
4.0000 mg | ORAL_TABLET | Freq: Four times a day (QID) | ORAL | 2 refills | Status: DC | PRN
  Filled 2023-09-27: qty 30, 8d supply, fill #0

## 2023-08-08 MED ORDER — ATORVASTATIN CALCIUM 10 MG PO TABS
10.0000 mg | ORAL_TABLET | Freq: Every day | ORAL | 0 refills | Status: AC
  Filled 2023-08-14 – 2023-09-09 (×6): qty 90, 90d supply, fill #0
  Filled 2023-12-12: qty 30, 30d supply, fill #0
  Filled 2024-01-11 (×2): qty 30, 30d supply, fill #1
  Filled 2024-02-08 – 2024-02-14 (×2): qty 30, 30d supply, fill #2

## 2023-08-08 MED ORDER — LINACLOTIDE 72 MCG PO CAPS
ORAL_CAPSULE | ORAL | 2 refills | Status: DC
  Filled 2023-08-14 – 2023-08-16 (×5): qty 30, 30d supply, fill #0
  Filled 2023-09-04 – 2023-09-19 (×2): qty 30, 30d supply, fill #1
  Filled ????-??-??: fill #1

## 2023-08-08 MED ORDER — FAMOTIDINE 20 MG PO TABS
20.0000 mg | ORAL_TABLET | Freq: Two times a day (BID) | ORAL | 3 refills | Status: DC
  Filled 2023-08-14 – 2023-08-16 (×5): qty 60, 30d supply, fill #0

## 2023-08-08 MED ORDER — MIRTAZAPINE 30 MG PO TABS
30.0000 mg | ORAL_TABLET | Freq: Every day | ORAL | 6 refills | Status: AC
  Filled 2023-08-14 – 2023-10-12 (×8): qty 30, 30d supply, fill #0
  Filled 2023-11-13: qty 30, 30d supply, fill #1
  Filled 2023-12-12: qty 30, 30d supply, fill #0
  Filled 2023-12-12: qty 30, 30d supply, fill #2
  Filled 2024-01-26 – 2024-02-05 (×2): qty 30, 30d supply, fill #1

## 2023-08-08 MED ORDER — FERROUS SULFATE 325 (65 FE) MG PO TBEC
325.0000 mg | DELAYED_RELEASE_TABLET | Freq: Every day | ORAL | 6 refills | Status: AC
  Filled 2023-08-14: qty 30, 30d supply, fill #0
  Filled 2023-08-14 (×2): qty 100, 100d supply, fill #0
  Filled 2023-08-15 – 2023-08-16 (×2): qty 30, 30d supply, fill #0
  Filled 2023-09-04 – 2023-09-19 (×2): qty 30, 30d supply, fill #1
  Filled 2023-10-04 – 2023-10-12 (×2): qty 30, 30d supply, fill #2
  Filled 2023-11-13: qty 30, 30d supply, fill #3
  Filled 2023-12-12: qty 30, 30d supply, fill #4
  Filled 2023-12-12: qty 30, 30d supply, fill #0
  Filled ????-??-??: fill #1

## 2023-08-08 MED ORDER — MIRTAZAPINE 30 MG PO TABS
30.0000 mg | ORAL_TABLET | Freq: Every day | ORAL | 2 refills | Status: AC
  Filled 2023-08-14 – 2023-09-19 (×6): qty 30, 30d supply, fill #0
  Filled 2023-10-04 – 2023-10-12 (×2): qty 30, 30d supply, fill #1
  Filled ????-??-??: fill #0

## 2023-08-08 MED ORDER — DULOXETINE HCL 30 MG PO CPEP
60.0000 mg | ORAL_CAPSULE | Freq: Every day | ORAL | 3 refills | Status: DC
  Filled 2023-08-14 – 2023-08-16 (×5): qty 60, 30d supply, fill #0

## 2023-08-08 MED ORDER — DICLOFENAC EPOLAMINE 1.3 % EX PTCH
1.0000 | MEDICATED_PATCH | Freq: Two times a day (BID) | CUTANEOUS | 3 refills | Status: AC
  Filled 2023-08-08: qty 60, 30d supply, fill #0
  Filled 2023-09-09: qty 60, 30d supply, fill #1
  Filled 2024-01-30: qty 60, 30d supply, fill #2

## 2023-08-08 MED ORDER — LIDOCAINE 5 % EX PTCH
MEDICATED_PATCH | CUTANEOUS | 0 refills | Status: AC
  Filled 2023-09-09 – 2023-10-06 (×2): qty 10, 10d supply, fill #0

## 2023-08-08 MED ORDER — LOSARTAN POTASSIUM 100 MG PO TABS
100.0000 mg | ORAL_TABLET | Freq: Every day | ORAL | 0 refills | Status: AC
  Filled 2023-08-14 – 2023-08-21 (×6): qty 90, 90d supply, fill #0
  Filled 2023-12-12: qty 30, 30d supply, fill #0

## 2023-08-08 MED ORDER — CYCLOBENZAPRINE HCL 10 MG PO TABS
10.0000 mg | ORAL_TABLET | Freq: Three times a day (TID) | ORAL | 1 refills | Status: DC | PRN
  Filled 2023-08-18 – 2023-08-21 (×5): qty 90, 30d supply, fill #0
  Filled 2023-11-09: qty 90, 30d supply, fill #1

## 2023-08-08 MED ORDER — NALOXONE HCL 4 MG/0.1ML NA LIQD
NASAL | 1 refills | Status: AC
  Filled 2023-09-09 – 2023-10-06 (×2): qty 2, 1d supply, fill #0
  Filled 2024-01-30: qty 2, 1d supply, fill #1

## 2023-08-08 MED ORDER — DEXCOM G6 SENSOR MISC
3.0000 | 4 refills | Status: DC
  Filled 2023-09-09: qty 3, 30d supply, fill #0

## 2023-08-09 ENCOUNTER — Other Ambulatory Visit (HOSPITAL_BASED_OUTPATIENT_CLINIC_OR_DEPARTMENT_OTHER): Payer: Self-pay

## 2023-08-09 MED ORDER — DEXCOM G7 SENSOR MISC
5 refills | Status: AC
  Filled 2023-09-09: qty 3, 30d supply, fill #0
  Filled 2023-10-06 – 2023-10-12 (×3): qty 3, 30d supply, fill #1
  Filled 2023-11-09: qty 3, 30d supply, fill #2
  Filled 2023-12-12 (×2): qty 3, 30d supply, fill #3
  Filled 2024-01-08: qty 3, 30d supply, fill #4
  Filled 2024-05-17 – 2024-05-23 (×2): qty 3, 30d supply, fill #5

## 2023-08-09 MED ORDER — DEXCOM G6 SENSOR MISC: 5 refills | Status: DC

## 2023-08-09 MED ORDER — LIDOCAINE 5 % EX OINT
1.0000 | TOPICAL_OINTMENT | Freq: Two times a day (BID) | CUTANEOUS | 0 refills | Status: DC
  Filled 2023-08-15: qty 35.44, 30d supply, fill #0
  Filled 2023-08-15: qty 50, 30d supply, fill #0

## 2023-08-09 MED ORDER — MONTELUKAST SODIUM 10 MG PO TABS
10.0000 mg | ORAL_TABLET | Freq: Every day | ORAL | 1 refills | Status: AC
  Filled 2023-08-14 (×2): qty 90, 90d supply, fill #0
  Filled 2023-09-19: qty 30, 30d supply, fill #0
  Filled 2023-10-04 – 2023-10-12 (×2): qty 30, 30d supply, fill #1
  Filled 2023-11-13: qty 30, 30d supply, fill #2
  Filled 2023-12-12: qty 30, 30d supply, fill #0
  Filled 2023-12-12: qty 30, 30d supply, fill #3
  Filled 2024-01-11 (×2): qty 30, 30d supply, fill #1
  Filled 2024-02-08 – 2024-02-14 (×2): qty 30, 30d supply, fill #2

## 2023-08-09 MED ORDER — DEXCOM G7 RECEIVER DEVI
0 refills | Status: AC
  Filled 2023-09-09: qty 1, 90d supply, fill #0
  Filled 2023-09-11: qty 1, 10d supply, fill #0
  Filled 2023-10-06: qty 1, 90d supply, fill #0

## 2023-08-09 MED ORDER — INSULIN PEN NEEDLE 31G X 5 MM MISC
9 refills | Status: AC
  Filled 2023-09-05: qty 100, 30d supply, fill #0
  Filled 2023-10-06: qty 100, 30d supply, fill #1
  Filled 2024-01-30: qty 100, 30d supply, fill #0

## 2023-08-09 MED ORDER — ERGOCALCIFEROL 1.25 MG (50000 UT) PO CAPS
50000.0000 [IU] | ORAL_CAPSULE | ORAL | 6 refills | Status: AC
  Filled 2023-08-14 – 2023-08-16 (×5): qty 4, 28d supply, fill #0

## 2023-08-09 MED ORDER — CELECOXIB 200 MG PO CAPS
200.0000 mg | ORAL_CAPSULE | Freq: Every day | ORAL | 4 refills | Status: DC
  Filled 2023-08-14 – 2023-08-16 (×5): qty 30, 30d supply, fill #0
  Filled 2023-08-18 – 2023-09-19 (×3): qty 30, 30d supply, fill #1
  Filled 2023-10-04 – 2023-10-12 (×2): qty 30, 30d supply, fill #2
  Filled 2023-11-13: qty 30, 30d supply, fill #3
  Filled 2023-12-12: qty 30, 30d supply, fill #4
  Filled 2023-12-12: qty 30, 30d supply, fill #0
  Filled ????-??-??: fill #1

## 2023-08-09 MED ORDER — EASY TOUCH ALCOHOL PREP MEDIUM 70 % PADS
MEDICATED_PAD | 0 refills | Status: DC
  Filled 2023-08-14: qty 100, 30d supply, fill #0

## 2023-08-09 MED ORDER — DEXCOM G6 SENSOR MISC
4 refills | Status: DC
  Filled 2023-09-09 – 2023-10-06 (×2): qty 3, 30d supply, fill #0

## 2023-08-09 MED ORDER — NICOTINE 14 MG/24HR TD PT24
14.0000 mg | MEDICATED_PATCH | Freq: Every day | TRANSDERMAL | 3 refills | Status: AC
  Filled 2023-09-09: qty 28, 28d supply, fill #0

## 2023-08-09 MED ORDER — NALOXONE HCL 4 MG/0.1ML NA LIQD
NASAL | 1 refills | Status: AC
  Filled 2023-08-15: qty 2, 1d supply, fill #0

## 2023-08-09 MED ORDER — DEXCOM G6 TRANSMITTER MISC
5 refills | Status: DC
  Filled 2023-09-09 – 2023-10-06 (×2): qty 1, 90d supply, fill #0

## 2023-08-10 ENCOUNTER — Other Ambulatory Visit (HOSPITAL_BASED_OUTPATIENT_CLINIC_OR_DEPARTMENT_OTHER): Payer: Self-pay

## 2023-08-14 ENCOUNTER — Other Ambulatory Visit: Payer: Self-pay

## 2023-08-14 ENCOUNTER — Other Ambulatory Visit (HOSPITAL_COMMUNITY): Payer: Self-pay

## 2023-08-15 ENCOUNTER — Other Ambulatory Visit (HOSPITAL_BASED_OUTPATIENT_CLINIC_OR_DEPARTMENT_OTHER): Payer: Self-pay

## 2023-08-15 ENCOUNTER — Other Ambulatory Visit (HOSPITAL_COMMUNITY): Payer: Self-pay

## 2023-08-15 ENCOUNTER — Other Ambulatory Visit: Payer: Self-pay

## 2023-08-16 ENCOUNTER — Other Ambulatory Visit: Payer: Self-pay

## 2023-08-16 ENCOUNTER — Other Ambulatory Visit (HOSPITAL_BASED_OUTPATIENT_CLINIC_OR_DEPARTMENT_OTHER): Payer: Self-pay

## 2023-08-16 MED ORDER — ALBUTEROL SULFATE HFA 108 (90 BASE) MCG/ACT IN AERS
1.0000 | INHALATION_SPRAY | RESPIRATORY_TRACT | 0 refills | Status: AC
  Filled 2023-08-16: qty 20.1, 100d supply, fill #0
  Filled 2023-09-09: qty 20.1, 99d supply, fill #0
  Filled 2023-10-06: qty 20.1, 90d supply, fill #0
  Filled 2023-12-14: qty 6.7, 33d supply, fill #0
  Filled 2024-01-26: qty 6.7, 33d supply, fill #1

## 2023-08-16 MED ORDER — BUDESONIDE-FORMOTEROL FUMARATE 160-4.5 MCG/ACT IN AERO
2.0000 | INHALATION_SPRAY | Freq: Two times a day (BID) | RESPIRATORY_TRACT | 0 refills | Status: DC
  Filled 2023-08-16: qty 10.2, 30d supply, fill #0

## 2023-08-17 ENCOUNTER — Other Ambulatory Visit (HOSPITAL_BASED_OUTPATIENT_CLINIC_OR_DEPARTMENT_OTHER): Payer: Self-pay

## 2023-08-17 ENCOUNTER — Other Ambulatory Visit (HOSPITAL_COMMUNITY): Payer: Self-pay

## 2023-08-18 ENCOUNTER — Other Ambulatory Visit: Payer: Self-pay

## 2023-08-18 ENCOUNTER — Other Ambulatory Visit (HOSPITAL_BASED_OUTPATIENT_CLINIC_OR_DEPARTMENT_OTHER): Payer: Self-pay

## 2023-08-18 ENCOUNTER — Other Ambulatory Visit (HOSPITAL_COMMUNITY): Payer: Self-pay

## 2023-08-21 ENCOUNTER — Other Ambulatory Visit (HOSPITAL_COMMUNITY): Payer: Self-pay

## 2023-08-21 ENCOUNTER — Other Ambulatory Visit (HOSPITAL_BASED_OUTPATIENT_CLINIC_OR_DEPARTMENT_OTHER): Payer: Self-pay

## 2023-08-21 MED ORDER — ZOLPIDEM TARTRATE 10 MG PO TABS
10.0000 mg | ORAL_TABLET | Freq: Every day | ORAL | 5 refills | Status: DC
  Filled 2023-08-21 – 2023-09-27 (×2): qty 30, 30d supply, fill #0
  Filled 2023-11-08: qty 30, 30d supply, fill #1
  Filled 2023-12-13 (×3): qty 30, 30d supply, fill #2
  Filled 2024-01-30: qty 30, 30d supply, fill #3

## 2023-08-22 ENCOUNTER — Other Ambulatory Visit (HOSPITAL_BASED_OUTPATIENT_CLINIC_OR_DEPARTMENT_OTHER): Payer: Self-pay

## 2023-08-22 ENCOUNTER — Other Ambulatory Visit: Payer: Self-pay

## 2023-08-23 ENCOUNTER — Other Ambulatory Visit (HOSPITAL_BASED_OUTPATIENT_CLINIC_OR_DEPARTMENT_OTHER): Payer: Self-pay

## 2023-08-23 ENCOUNTER — Other Ambulatory Visit: Payer: Self-pay

## 2023-08-23 MED ORDER — DULOXETINE HCL 30 MG PO CPEP
60.0000 mg | ORAL_CAPSULE | Freq: Every day | ORAL | 6 refills | Status: AC
Start: 2023-08-23 — End: ?
  Filled 2023-09-04 – 2023-09-19 (×2): qty 60, 30d supply, fill #0
  Filled 2023-10-04 – 2023-10-12 (×2): qty 60, 30d supply, fill #1
  Filled 2023-11-13: qty 60, 30d supply, fill #2
  Filled 2023-12-12: qty 60, 30d supply, fill #0
  Filled 2023-12-12: qty 60, 30d supply, fill #3
  Filled 2024-01-11 (×2): qty 60, 30d supply, fill #1
  Filled 2024-02-08 – 2024-02-14 (×2): qty 60, 30d supply, fill #2
  Filled 2024-03-11 – 2024-03-18 (×2): qty 60, 30d supply, fill #3
  Filled ????-??-??: fill #0

## 2023-08-23 MED ORDER — FAMOTIDINE 20 MG PO TABS
20.0000 mg | ORAL_TABLET | Freq: Two times a day (BID) | ORAL | 6 refills | Status: DC
  Filled 2023-09-04 – 2023-09-19 (×2): qty 60, 30d supply, fill #0
  Filled 2023-10-04 – 2023-10-12 (×2): qty 60, 30d supply, fill #1
  Filled 2023-11-13: qty 60, 30d supply, fill #2
  Filled 2023-12-12: qty 60, 30d supply, fill #0
  Filled 2023-12-12: qty 60, 30d supply, fill #3
  Filled 2024-01-11 (×2): qty 60, 30d supply, fill #1
  Filled ????-??-??: fill #0

## 2023-08-23 MED ORDER — ATORVASTATIN CALCIUM 10 MG PO TABS
10.0000 mg | ORAL_TABLET | Freq: Every day | ORAL | 0 refills | Status: DC
  Filled 2023-08-23 – 2023-09-11 (×4): qty 90, 90d supply, fill #0
  Filled 2023-09-19: qty 30, 30d supply, fill #0
  Filled 2023-10-04 – 2023-10-12 (×2): qty 30, 30d supply, fill #1
  Filled 2023-11-13: qty 30, 30d supply, fill #2
  Filled ????-??-??: fill #0

## 2023-08-23 MED ORDER — PREGABALIN 150 MG PO CAPS
150.0000 mg | ORAL_CAPSULE | Freq: Two times a day (BID) | ORAL | 5 refills | Status: AC
  Filled 2023-09-27: qty 60, 30d supply, fill #0
  Filled 2023-11-08: qty 60, 30d supply, fill #1
  Filled 2023-12-13 (×4): qty 60, 30d supply, fill #2
  Filled 2024-01-26: qty 60, 30d supply, fill #0

## 2023-08-23 MED ORDER — METOPROLOL TARTRATE 25 MG PO TABS
25.0000 mg | ORAL_TABLET | Freq: Every day | ORAL | 1 refills | Status: AC
  Filled 2023-08-23 – 2023-09-11 (×4): qty 90, 90d supply, fill #0
  Filled 2023-09-19: qty 30, 30d supply, fill #0
  Filled 2023-09-27 – 2023-10-12 (×3): qty 30, 30d supply, fill #1
  Filled 2023-11-13: qty 30, 30d supply, fill #2
  Filled 2023-12-12: qty 30, 30d supply, fill #3
  Filled 2023-12-12: qty 30, 30d supply, fill #0
  Filled ????-??-??: fill #0

## 2023-08-23 MED ORDER — PANTOPRAZOLE SODIUM 40 MG PO TBEC
40.0000 mg | DELAYED_RELEASE_TABLET | Freq: Two times a day (BID) | ORAL | 6 refills | Status: AC
  Filled 2023-09-04 – 2023-10-12 (×3): qty 60, 30d supply, fill #0
  Filled 2023-11-13: qty 60, 30d supply, fill #1
  Filled 2023-12-12: qty 60, 30d supply, fill #2
  Filled 2023-12-12: qty 60, 30d supply, fill #0
  Filled 2024-01-26 – 2024-02-05 (×2): qty 60, 30d supply, fill #1

## 2023-08-23 MED ORDER — EASY TOUCH ALCOHOL PREP MEDIUM 70 % PADS
1.0000 | MEDICATED_PAD | Freq: Two times a day (BID) | 2 refills | Status: AC
  Filled 2023-08-23: qty 200, 90d supply, fill #0
  Filled 2023-09-05 – 2023-09-07 (×2): qty 200, 100d supply, fill #0
  Filled 2024-01-30: qty 200, 100d supply, fill #1

## 2023-08-23 MED ORDER — LIDOCAINE 5 % EX OINT
1.0000 | TOPICAL_OINTMENT | Freq: Two times a day (BID) | CUTANEOUS | 2 refills | Status: AC
Start: 2023-08-23 — End: ?
  Filled 2023-09-27: qty 100, 50d supply, fill #0
  Filled 2023-11-09: qty 100, 50d supply, fill #1
  Filled 2023-12-14 (×2): qty 100, 30d supply, fill #0

## 2023-08-23 MED ORDER — LOSARTAN POTASSIUM 100 MG PO TABS
100.0000 mg | ORAL_TABLET | Freq: Every day | ORAL | 3 refills | Status: AC
  Filled 2023-08-23 – 2023-09-11 (×4): qty 90, 90d supply, fill #0
  Filled 2023-09-11 – 2023-09-19 (×2): qty 30, 30d supply, fill #0
  Filled 2023-10-04 – 2023-10-12 (×2): qty 30, 30d supply, fill #1
  Filled 2023-11-13: qty 30, 30d supply, fill #2
  Filled 2023-12-12: qty 30, 30d supply, fill #3
  Filled 2023-12-12: qty 30, 30d supply, fill #0
  Filled 2024-01-11 (×2): qty 30, 30d supply, fill #1
  Filled 2024-02-08 – 2024-02-14 (×2): qty 30, 30d supply, fill #2
  Filled 2024-03-11 – 2024-03-18 (×2): qty 30, 30d supply, fill #3
  Filled 2024-04-12: qty 30, 30d supply, fill #4
  Filled 2024-05-13: qty 30, 30d supply, fill #5
  Filled ????-??-??: fill #0

## 2023-08-23 MED ORDER — AMLODIPINE BESYLATE 10 MG PO TABS
10.0000 mg | ORAL_TABLET | Freq: Every day | ORAL | 3 refills | Status: AC
  Filled 2023-08-23 – 2023-08-28 (×2): qty 90, 90d supply, fill #0
  Filled 2023-08-29: qty 21, 21d supply, fill #0
  Filled 2023-09-04 – 2023-09-11 (×3): qty 21, 21d supply, fill #1
  Filled 2023-09-19: qty 30, 30d supply, fill #1
  Filled 2023-10-04 – 2023-10-12 (×2): qty 30, 30d supply, fill #2
  Filled 2023-11-13: qty 30, 30d supply, fill #3
  Filled 2023-12-12: qty 30, 30d supply, fill #4
  Filled 2023-12-12: qty 30, 30d supply, fill #0
  Filled 2024-01-11 (×2): qty 30, 30d supply, fill #1
  Filled 2024-02-08 – 2024-02-14 (×2): qty 30, 30d supply, fill #2
  Filled 2024-03-11 – 2024-03-18 (×2): qty 30, 30d supply, fill #3
  Filled 2024-04-12: qty 30, 30d supply, fill #4
  Filled 2024-05-13: qty 30, 30d supply, fill #5
  Filled ????-??-?? (×2): fill #1

## 2023-08-23 MED ORDER — OXYCODONE-ACETAMINOPHEN 10-325 MG PO TABS
1.0000 | ORAL_TABLET | Freq: Every day | ORAL | 0 refills | Status: DC
  Filled 2023-08-29: qty 150, 30d supply, fill #0

## 2023-08-23 MED ORDER — VITAMIN D (ERGOCALCIFEROL) 1.25 MG (50000 UNIT) PO CAPS
50000.0000 [IU] | ORAL_CAPSULE | ORAL | 6 refills | Status: AC
  Filled 2023-09-04 – 2023-10-06 (×2): qty 4, 28d supply, fill #0

## 2023-08-24 ENCOUNTER — Other Ambulatory Visit (HOSPITAL_BASED_OUTPATIENT_CLINIC_OR_DEPARTMENT_OTHER): Payer: Self-pay

## 2023-08-24 ENCOUNTER — Other Ambulatory Visit (HOSPITAL_COMMUNITY): Payer: Self-pay

## 2023-08-24 ENCOUNTER — Other Ambulatory Visit: Payer: Self-pay

## 2023-08-28 ENCOUNTER — Other Ambulatory Visit (HOSPITAL_BASED_OUTPATIENT_CLINIC_OR_DEPARTMENT_OTHER): Payer: Self-pay

## 2023-08-29 ENCOUNTER — Other Ambulatory Visit: Payer: Self-pay

## 2023-08-29 ENCOUNTER — Other Ambulatory Visit (HOSPITAL_BASED_OUTPATIENT_CLINIC_OR_DEPARTMENT_OTHER): Payer: Self-pay

## 2023-08-30 ENCOUNTER — Other Ambulatory Visit (HOSPITAL_BASED_OUTPATIENT_CLINIC_OR_DEPARTMENT_OTHER): Payer: Self-pay

## 2023-08-31 ENCOUNTER — Other Ambulatory Visit (HOSPITAL_COMMUNITY): Payer: Self-pay

## 2023-08-31 ENCOUNTER — Other Ambulatory Visit (HOSPITAL_BASED_OUTPATIENT_CLINIC_OR_DEPARTMENT_OTHER): Payer: Self-pay

## 2023-09-04 ENCOUNTER — Other Ambulatory Visit: Payer: Self-pay

## 2023-09-04 ENCOUNTER — Other Ambulatory Visit (HOSPITAL_COMMUNITY): Payer: Self-pay

## 2023-09-04 ENCOUNTER — Other Ambulatory Visit (HOSPITAL_BASED_OUTPATIENT_CLINIC_OR_DEPARTMENT_OTHER): Payer: Self-pay

## 2023-09-04 MED ORDER — VITAMIN D (ERGOCALCIFEROL) 1.25 MG (50000 UNIT) PO CAPS
50000.0000 [IU] | ORAL_CAPSULE | ORAL | 6 refills | Status: AC
  Filled 2023-09-04: qty 4, 28d supply, fill #0
  Filled 2023-11-13: qty 4, 28d supply, fill #1
  Filled 2023-12-12: qty 4, 28d supply, fill #2
  Filled 2024-01-08: qty 4, 28d supply, fill #3

## 2023-09-04 MED ORDER — BUDESONIDE-FORMOTEROL FUMARATE 160-4.5 MCG/ACT IN AERO
2.0000 | INHALATION_SPRAY | Freq: Two times a day (BID) | RESPIRATORY_TRACT | 0 refills | Status: AC
Start: 2023-09-04 — End: ?
  Filled 2023-09-04 – 2023-09-08 (×2): qty 10.2, 30d supply, fill #0

## 2023-09-05 ENCOUNTER — Other Ambulatory Visit (HOSPITAL_BASED_OUTPATIENT_CLINIC_OR_DEPARTMENT_OTHER): Payer: Self-pay

## 2023-09-05 ENCOUNTER — Other Ambulatory Visit: Payer: Self-pay

## 2023-09-05 ENCOUNTER — Other Ambulatory Visit (HOSPITAL_COMMUNITY): Payer: Self-pay

## 2023-09-05 MED ORDER — INSULIN LISPRO (1 UNIT DIAL) 100 UNIT/ML (KWIKPEN)
6.0000 [IU] | PEN_INJECTOR | Freq: Three times a day (TID) | SUBCUTANEOUS | 5 refills | Status: AC
Start: 2023-09-05 — End: ?
  Filled 2023-09-05 (×2): qty 15, 84d supply, fill #0
  Filled 2023-12-13 (×3): qty 15, 84d supply, fill #1

## 2023-09-05 MED ORDER — DAPAGLIFLOZIN PROPANEDIOL 5 MG PO TABS
5.0000 mg | ORAL_TABLET | Freq: Every day | ORAL | 3 refills | Status: AC
  Filled 2023-09-05 – 2023-09-06 (×3): qty 30, 30d supply, fill #0
  Filled 2023-10-06 – 2023-10-12 (×2): qty 30, 30d supply, fill #1
  Filled 2023-11-13: qty 30, 30d supply, fill #2
  Filled 2023-12-12: qty 30, 30d supply, fill #3
  Filled 2023-12-12: qty 30, 30d supply, fill #0

## 2023-09-06 ENCOUNTER — Other Ambulatory Visit (HOSPITAL_BASED_OUTPATIENT_CLINIC_OR_DEPARTMENT_OTHER): Payer: Self-pay

## 2023-09-06 ENCOUNTER — Other Ambulatory Visit: Payer: Self-pay

## 2023-09-07 ENCOUNTER — Other Ambulatory Visit: Payer: Self-pay

## 2023-09-07 ENCOUNTER — Other Ambulatory Visit (HOSPITAL_BASED_OUTPATIENT_CLINIC_OR_DEPARTMENT_OTHER): Payer: Self-pay

## 2023-09-08 ENCOUNTER — Other Ambulatory Visit (HOSPITAL_COMMUNITY): Payer: Self-pay

## 2023-09-10 ENCOUNTER — Other Ambulatory Visit (HOSPITAL_COMMUNITY): Payer: Self-pay

## 2023-09-10 ENCOUNTER — Other Ambulatory Visit (HOSPITAL_BASED_OUTPATIENT_CLINIC_OR_DEPARTMENT_OTHER): Payer: Self-pay

## 2023-09-11 ENCOUNTER — Other Ambulatory Visit: Payer: Self-pay

## 2023-09-11 ENCOUNTER — Other Ambulatory Visit (HOSPITAL_BASED_OUTPATIENT_CLINIC_OR_DEPARTMENT_OTHER): Payer: Self-pay

## 2023-09-11 ENCOUNTER — Encounter: Payer: Self-pay | Admitting: Pharmacist

## 2023-09-11 ENCOUNTER — Other Ambulatory Visit (HOSPITAL_COMMUNITY): Payer: Self-pay

## 2023-09-12 ENCOUNTER — Other Ambulatory Visit: Payer: Self-pay

## 2023-09-14 ENCOUNTER — Other Ambulatory Visit: Payer: Self-pay

## 2023-09-19 ENCOUNTER — Other Ambulatory Visit (HOSPITAL_BASED_OUTPATIENT_CLINIC_OR_DEPARTMENT_OTHER): Payer: Self-pay

## 2023-09-19 ENCOUNTER — Other Ambulatory Visit: Payer: Self-pay

## 2023-09-21 ENCOUNTER — Other Ambulatory Visit: Payer: Self-pay

## 2023-09-27 ENCOUNTER — Other Ambulatory Visit (HOSPITAL_BASED_OUTPATIENT_CLINIC_OR_DEPARTMENT_OTHER): Payer: Self-pay

## 2023-09-27 ENCOUNTER — Other Ambulatory Visit (HOSPITAL_COMMUNITY): Payer: Self-pay

## 2023-09-27 MED ORDER — DIAZEPAM 10 MG PO TABS
10.0000 mg | ORAL_TABLET | ORAL | 1 refills | Status: AC
  Filled 2023-09-27: qty 5, 5d supply, fill #0

## 2023-09-27 MED ORDER — OXYCODONE-ACETAMINOPHEN 10-325 MG PO TABS
1.0000 | ORAL_TABLET | Freq: Every day | ORAL | 0 refills | Status: AC
  Filled 2023-09-27: qty 150, 30d supply, fill #0

## 2023-09-28 ENCOUNTER — Other Ambulatory Visit: Payer: Self-pay

## 2023-09-28 ENCOUNTER — Other Ambulatory Visit (HOSPITAL_BASED_OUTPATIENT_CLINIC_OR_DEPARTMENT_OTHER): Payer: Self-pay

## 2023-10-04 ENCOUNTER — Other Ambulatory Visit: Payer: Self-pay

## 2023-10-05 ENCOUNTER — Other Ambulatory Visit (HOSPITAL_COMMUNITY): Payer: Self-pay

## 2023-10-05 MED ORDER — LINZESS 72 MCG PO CAPS
72.0000 ug | ORAL_CAPSULE | Freq: Every day | ORAL | 2 refills | Status: DC
  Filled 2023-10-06 – 2023-10-16 (×3): qty 30, 30d supply, fill #0
  Filled 2023-11-13 – 2023-11-14 (×2): qty 30, 30d supply, fill #1
  Filled 2023-12-12 – 2023-12-13 (×3): qty 30, 30d supply, fill #2

## 2023-10-06 ENCOUNTER — Other Ambulatory Visit (HOSPITAL_COMMUNITY): Payer: Self-pay

## 2023-10-06 ENCOUNTER — Other Ambulatory Visit: Payer: Self-pay

## 2023-10-09 ENCOUNTER — Other Ambulatory Visit (HOSPITAL_COMMUNITY): Payer: Self-pay

## 2023-10-10 ENCOUNTER — Other Ambulatory Visit: Payer: Self-pay

## 2023-10-12 ENCOUNTER — Other Ambulatory Visit: Payer: Self-pay

## 2023-10-12 ENCOUNTER — Other Ambulatory Visit (HOSPITAL_BASED_OUTPATIENT_CLINIC_OR_DEPARTMENT_OTHER): Payer: Self-pay

## 2023-10-12 ENCOUNTER — Other Ambulatory Visit (HOSPITAL_COMMUNITY): Payer: Self-pay

## 2023-10-12 MED ORDER — DAPAGLIFLOZIN PROPANEDIOL 5 MG PO TABS
ORAL_TABLET | ORAL | 3 refills | Status: DC
  Filled 2023-10-12: qty 60, 30d supply, fill #0

## 2023-10-13 ENCOUNTER — Other Ambulatory Visit (HOSPITAL_BASED_OUTPATIENT_CLINIC_OR_DEPARTMENT_OTHER): Payer: Self-pay

## 2023-10-13 ENCOUNTER — Other Ambulatory Visit: Payer: Self-pay

## 2023-10-16 ENCOUNTER — Other Ambulatory Visit: Payer: Self-pay

## 2023-10-17 ENCOUNTER — Other Ambulatory Visit (HOSPITAL_COMMUNITY): Payer: Self-pay

## 2023-10-18 ENCOUNTER — Other Ambulatory Visit (HOSPITAL_BASED_OUTPATIENT_CLINIC_OR_DEPARTMENT_OTHER): Payer: Self-pay

## 2023-10-19 ENCOUNTER — Other Ambulatory Visit (HOSPITAL_BASED_OUTPATIENT_CLINIC_OR_DEPARTMENT_OTHER): Payer: Self-pay

## 2023-10-20 ENCOUNTER — Other Ambulatory Visit (HOSPITAL_COMMUNITY): Payer: Self-pay

## 2023-10-20 ENCOUNTER — Other Ambulatory Visit: Payer: Self-pay

## 2023-10-20 ENCOUNTER — Other Ambulatory Visit (HOSPITAL_BASED_OUTPATIENT_CLINIC_OR_DEPARTMENT_OTHER): Payer: Self-pay

## 2023-10-23 ENCOUNTER — Other Ambulatory Visit (HOSPITAL_BASED_OUTPATIENT_CLINIC_OR_DEPARTMENT_OTHER): Payer: Self-pay

## 2023-10-23 ENCOUNTER — Other Ambulatory Visit: Payer: Self-pay

## 2023-10-23 MED ORDER — CLINDAMYCIN HCL 300 MG PO CAPS
300.0000 mg | ORAL_CAPSULE | Freq: Three times a day (TID) | ORAL | 0 refills | Status: DC
  Filled 2023-10-23: qty 21, 7d supply, fill #0

## 2023-10-23 MED ORDER — OXYCODONE HCL 10 MG PO TABS
10.0000 mg | ORAL_TABLET | ORAL | 0 refills | Status: DC | PRN
  Filled 2023-10-23: qty 10, 2d supply, fill #0

## 2023-10-23 MED ORDER — ACETAMINOPHEN 325 MG PO TABS
650.0000 mg | ORAL_TABLET | ORAL | 0 refills | Status: DC
  Filled 2023-10-23: qty 30, 3d supply, fill #0

## 2023-10-25 ENCOUNTER — Other Ambulatory Visit (HOSPITAL_BASED_OUTPATIENT_CLINIC_OR_DEPARTMENT_OTHER): Payer: Self-pay

## 2023-10-25 ENCOUNTER — Other Ambulatory Visit: Payer: Self-pay

## 2023-10-25 ENCOUNTER — Other Ambulatory Visit (HOSPITAL_COMMUNITY): Payer: Self-pay

## 2023-11-08 ENCOUNTER — Other Ambulatory Visit (HOSPITAL_BASED_OUTPATIENT_CLINIC_OR_DEPARTMENT_OTHER): Payer: Self-pay

## 2023-11-09 ENCOUNTER — Other Ambulatory Visit (HOSPITAL_BASED_OUTPATIENT_CLINIC_OR_DEPARTMENT_OTHER): Payer: Self-pay

## 2023-11-09 MED ORDER — INSULIN LISPRO 100 UNIT/ML IJ SOLN
75.0000 [IU] | Freq: Every day | INTRAMUSCULAR | 3 refills | Status: AC
  Filled 2023-11-09 – 2023-12-13 (×3): qty 30, 40d supply, fill #0
  Filled 2023-12-14 – 2024-01-26 (×2): qty 30, 40d supply, fill #1
  Filled 2024-04-02: qty 30, 40d supply, fill #2
  Filled 2024-04-02: qty 30, 40d supply, fill #0

## 2023-11-13 ENCOUNTER — Other Ambulatory Visit: Payer: Self-pay

## 2023-11-13 ENCOUNTER — Other Ambulatory Visit (HOSPITAL_COMMUNITY): Payer: Self-pay

## 2023-11-14 ENCOUNTER — Other Ambulatory Visit: Payer: Self-pay

## 2023-11-23 ENCOUNTER — Other Ambulatory Visit: Payer: Self-pay

## 2023-12-04 ENCOUNTER — Other Ambulatory Visit (HOSPITAL_COMMUNITY): Payer: Self-pay

## 2023-12-04 ENCOUNTER — Other Ambulatory Visit: Payer: Self-pay

## 2023-12-04 MED ORDER — ATORVASTATIN CALCIUM 10 MG PO TABS
10.0000 mg | ORAL_TABLET | Freq: Every day | ORAL | 0 refills | Status: DC
  Filled 2023-12-04: qty 90, 90d supply, fill #0
  Filled 2023-12-12: qty 30, 30d supply, fill #0

## 2023-12-05 ENCOUNTER — Other Ambulatory Visit (HOSPITAL_COMMUNITY): Payer: Self-pay

## 2023-12-12 ENCOUNTER — Other Ambulatory Visit: Payer: Self-pay

## 2023-12-12 ENCOUNTER — Other Ambulatory Visit (HOSPITAL_COMMUNITY): Payer: Self-pay

## 2023-12-13 ENCOUNTER — Other Ambulatory Visit (HOSPITAL_COMMUNITY): Payer: Self-pay

## 2023-12-13 ENCOUNTER — Other Ambulatory Visit (HOSPITAL_BASED_OUTPATIENT_CLINIC_OR_DEPARTMENT_OTHER): Payer: Self-pay

## 2023-12-13 ENCOUNTER — Other Ambulatory Visit: Payer: Self-pay

## 2023-12-14 ENCOUNTER — Other Ambulatory Visit: Payer: Self-pay

## 2023-12-14 ENCOUNTER — Other Ambulatory Visit (HOSPITAL_BASED_OUTPATIENT_CLINIC_OR_DEPARTMENT_OTHER): Payer: Self-pay

## 2023-12-14 ENCOUNTER — Other Ambulatory Visit (HOSPITAL_COMMUNITY): Payer: Self-pay

## 2023-12-15 ENCOUNTER — Other Ambulatory Visit (HOSPITAL_COMMUNITY): Payer: Self-pay

## 2023-12-18 ENCOUNTER — Other Ambulatory Visit (HOSPITAL_BASED_OUTPATIENT_CLINIC_OR_DEPARTMENT_OTHER): Payer: Self-pay

## 2023-12-20 ENCOUNTER — Other Ambulatory Visit (HOSPITAL_BASED_OUTPATIENT_CLINIC_OR_DEPARTMENT_OTHER): Payer: Self-pay

## 2023-12-21 ENCOUNTER — Other Ambulatory Visit (HOSPITAL_BASED_OUTPATIENT_CLINIC_OR_DEPARTMENT_OTHER): Payer: Self-pay

## 2023-12-21 ENCOUNTER — Other Ambulatory Visit: Payer: Self-pay

## 2023-12-21 MED ORDER — LIDOCAINE 5 % EX OINT
1.0000 | TOPICAL_OINTMENT | Freq: Two times a day (BID) | CUTANEOUS | 2 refills | Status: AC
  Filled 2024-01-29: qty 100, 50d supply, fill #0
  Filled 2024-02-29: qty 100, 30d supply, fill #1

## 2023-12-21 MED ORDER — ONDANSETRON HCL 4 MG PO TABS
4.0000 mg | ORAL_TABLET | Freq: Three times a day (TID) | ORAL | 1 refills | Status: AC | PRN
  Filled 2023-12-21: qty 90, 30d supply, fill #0

## 2023-12-21 MED ORDER — METOPROLOL TARTRATE 25 MG PO TABS
25.0000 mg | ORAL_TABLET | Freq: Every day | ORAL | 1 refills | Status: AC
  Filled 2024-01-11 (×2): qty 30, 30d supply, fill #0
  Filled 2024-02-08 – 2024-02-14 (×2): qty 30, 30d supply, fill #1
  Filled 2024-03-11 – 2024-03-18 (×2): qty 30, 30d supply, fill #2
  Filled 2024-04-12: qty 30, 30d supply, fill #3
  Filled 2024-05-13: qty 30, 30d supply, fill #4

## 2023-12-21 MED ORDER — CETIRIZINE HCL 10 MG PO TABS
10.0000 mg | ORAL_TABLET | Freq: Every day | ORAL | 3 refills | Status: AC
  Filled 2023-12-21 – 2023-12-22 (×2): qty 30, 30d supply, fill #0
  Filled 2024-01-11 (×2): qty 30, 30d supply, fill #1
  Filled 2024-02-08 – 2024-02-14 (×2): qty 30, 30d supply, fill #2
  Filled 2024-03-11 – 2024-03-18 (×2): qty 30, 30d supply, fill #3

## 2023-12-21 MED ORDER — IRON 325 (65 FE) MG PO TABS
325.0000 mg | ORAL_TABLET | Freq: Every day | ORAL | 6 refills | Status: AC
  Filled 2023-12-21 – 2024-01-11 (×4): qty 30, 30d supply, fill #0
  Filled 2024-02-08 – 2024-02-14 (×2): qty 30, 30d supply, fill #1
  Filled 2024-03-11 – 2024-04-12 (×4): qty 30, 30d supply, fill #2

## 2023-12-21 MED ORDER — FLUTICASONE PROPIONATE 50 MCG/ACT NA SUSP
2.0000 | Freq: Every day | NASAL | 1 refills | Status: AC
  Filled 2023-12-21: qty 16, 30d supply, fill #0
  Filled 2024-01-26 (×2): qty 16, 30d supply, fill #1

## 2023-12-21 MED ORDER — DICLOFENAC EPOLAMINE 1.3 % EX PTCH
1.0000 | MEDICATED_PATCH | Freq: Two times a day (BID) | CUTANEOUS | 3 refills | Status: AC | PRN
  Filled 2023-12-21: qty 60, 30d supply, fill #0

## 2023-12-21 MED ORDER — CYCLOBENZAPRINE HCL 10 MG PO TABS
10.0000 mg | ORAL_TABLET | Freq: Three times a day (TID) | ORAL | 5 refills | Status: AC | PRN
  Filled 2023-12-21: qty 90, 30d supply, fill #0
  Filled 2024-04-02: qty 90, 30d supply, fill #1

## 2023-12-21 MED ORDER — PREGABALIN 150 MG PO CAPS
150.0000 mg | ORAL_CAPSULE | Freq: Two times a day (BID) | ORAL | 5 refills | Status: AC
  Filled 2024-02-29: qty 60, 30d supply, fill #0

## 2023-12-21 MED ORDER — MIRTAZAPINE 30 MG PO TABS
30.0000 mg | ORAL_TABLET | Freq: Every day | ORAL | 6 refills | Status: AC
  Filled 2024-01-11 (×2): qty 30, 30d supply, fill #0
  Filled 2024-01-29 – 2024-02-14 (×3): qty 30, 30d supply, fill #1
  Filled 2024-03-11 – 2024-03-18 (×2): qty 30, 30d supply, fill #2
  Filled 2024-04-12: qty 30, 30d supply, fill #3
  Filled 2024-05-13: qty 30, 30d supply, fill #4

## 2023-12-21 MED ORDER — DEXCOM G7 SENSOR MISC
1.0000 | 5 refills | Status: AC
  Filled 2024-01-29 – 2024-02-17 (×6): qty 3, 30d supply, fill #0
  Filled 2024-02-19 – 2024-03-15 (×2): qty 3, 30d supply, fill #1
  Filled 2024-04-30: qty 3, 30d supply, fill #2

## 2023-12-21 MED ORDER — CELECOXIB 200 MG PO CAPS
200.0000 mg | ORAL_CAPSULE | Freq: Every day | ORAL | 6 refills | Status: AC
  Filled 2024-01-11 (×2): qty 30, 30d supply, fill #0
  Filled 2024-01-29 – 2024-02-14 (×3): qty 30, 30d supply, fill #1
  Filled 2024-03-11 – 2024-03-18 (×2): qty 30, 30d supply, fill #2
  Filled 2024-04-12: qty 30, 30d supply, fill #3
  Filled 2024-05-13: qty 30, 30d supply, fill #4

## 2023-12-21 MED ORDER — PANTOPRAZOLE SODIUM 40 MG PO TBEC
40.0000 mg | DELAYED_RELEASE_TABLET | Freq: Two times a day (BID) | ORAL | 6 refills | Status: AC
  Filled 2024-01-11 (×2): qty 60, 30d supply, fill #0
  Filled 2024-02-08 – 2024-02-14 (×2): qty 60, 30d supply, fill #1
  Filled 2024-03-11 – 2024-03-18 (×2): qty 60, 30d supply, fill #2
  Filled 2024-04-12: qty 60, 30d supply, fill #3
  Filled 2024-05-13: qty 60, 30d supply, fill #4

## 2023-12-22 ENCOUNTER — Other Ambulatory Visit: Payer: Self-pay

## 2023-12-26 ENCOUNTER — Other Ambulatory Visit (HOSPITAL_BASED_OUTPATIENT_CLINIC_OR_DEPARTMENT_OTHER): Payer: Self-pay

## 2023-12-27 ENCOUNTER — Other Ambulatory Visit (HOSPITAL_COMMUNITY): Payer: Self-pay

## 2024-01-05 ENCOUNTER — Other Ambulatory Visit (HOSPITAL_COMMUNITY): Payer: Self-pay

## 2024-01-05 MED ORDER — BUDESONIDE-FORMOTEROL FUMARATE 160-4.5 MCG/ACT IN AERO
2.0000 | INHALATION_SPRAY | Freq: Two times a day (BID) | RESPIRATORY_TRACT | 0 refills | Status: DC
  Filled 2024-01-05: qty 10.2, 30d supply, fill #0

## 2024-01-08 ENCOUNTER — Other Ambulatory Visit: Payer: Self-pay

## 2024-01-08 ENCOUNTER — Other Ambulatory Visit (HOSPITAL_COMMUNITY): Payer: Self-pay

## 2024-01-08 MED ORDER — LINZESS 72 MCG PO CAPS
ORAL_CAPSULE | ORAL | 2 refills | Status: AC
  Filled 2024-01-08 – 2024-01-09 (×2): qty 30, 30d supply, fill #0
  Filled 2024-02-29: qty 30, 30d supply, fill #1

## 2024-01-09 ENCOUNTER — Other Ambulatory Visit: Payer: Self-pay

## 2024-01-10 ENCOUNTER — Other Ambulatory Visit (HOSPITAL_COMMUNITY): Payer: Self-pay

## 2024-01-11 ENCOUNTER — Other Ambulatory Visit: Payer: Self-pay

## 2024-01-11 ENCOUNTER — Other Ambulatory Visit (HOSPITAL_COMMUNITY): Payer: Self-pay

## 2024-01-12 ENCOUNTER — Other Ambulatory Visit: Payer: Self-pay

## 2024-01-17 ENCOUNTER — Other Ambulatory Visit (HOSPITAL_COMMUNITY): Payer: Self-pay

## 2024-01-17 ENCOUNTER — Other Ambulatory Visit (HOSPITAL_BASED_OUTPATIENT_CLINIC_OR_DEPARTMENT_OTHER): Payer: Self-pay

## 2024-01-17 MED ORDER — ATORVASTATIN CALCIUM 20 MG PO TABS
20.0000 mg | ORAL_TABLET | Freq: Every day | ORAL | 0 refills | Status: AC
  Filled 2024-01-18: qty 30, 30d supply, fill #0
  Filled 2024-02-08 – 2024-02-14 (×2): qty 30, 30d supply, fill #1
  Filled 2024-03-14 – 2024-03-18 (×2): qty 30, 30d supply, fill #2

## 2024-01-18 ENCOUNTER — Other Ambulatory Visit: Payer: Self-pay

## 2024-01-22 ENCOUNTER — Other Ambulatory Visit: Payer: Self-pay

## 2024-01-26 ENCOUNTER — Other Ambulatory Visit (HOSPITAL_COMMUNITY): Payer: Self-pay

## 2024-01-26 ENCOUNTER — Other Ambulatory Visit (HOSPITAL_BASED_OUTPATIENT_CLINIC_OR_DEPARTMENT_OTHER): Payer: Self-pay

## 2024-01-26 ENCOUNTER — Other Ambulatory Visit: Payer: Self-pay

## 2024-01-29 ENCOUNTER — Other Ambulatory Visit (HOSPITAL_COMMUNITY): Payer: Self-pay

## 2024-01-29 ENCOUNTER — Other Ambulatory Visit: Payer: Self-pay

## 2024-01-30 ENCOUNTER — Other Ambulatory Visit: Payer: Self-pay

## 2024-01-30 ENCOUNTER — Other Ambulatory Visit (HOSPITAL_BASED_OUTPATIENT_CLINIC_OR_DEPARTMENT_OTHER): Payer: Self-pay

## 2024-01-30 ENCOUNTER — Other Ambulatory Visit (HOSPITAL_COMMUNITY): Payer: Self-pay

## 2024-01-31 ENCOUNTER — Other Ambulatory Visit: Payer: Self-pay

## 2024-01-31 ENCOUNTER — Other Ambulatory Visit (HOSPITAL_COMMUNITY): Payer: Self-pay

## 2024-01-31 MED ORDER — BUDESONIDE-FORMOTEROL FUMARATE 160-4.5 MCG/ACT IN AERO
2.0000 | INHALATION_SPRAY | Freq: Two times a day (BID) | RESPIRATORY_TRACT | 0 refills | Status: AC
  Filled 2024-01-31: qty 10.2, 30d supply, fill #0

## 2024-02-01 ENCOUNTER — Other Ambulatory Visit (HOSPITAL_COMMUNITY): Payer: Self-pay

## 2024-02-01 ENCOUNTER — Other Ambulatory Visit: Payer: Self-pay

## 2024-02-02 ENCOUNTER — Other Ambulatory Visit: Payer: Self-pay

## 2024-02-05 ENCOUNTER — Other Ambulatory Visit: Payer: Self-pay

## 2024-02-08 ENCOUNTER — Other Ambulatory Visit (HOSPITAL_COMMUNITY): Payer: Self-pay

## 2024-02-08 ENCOUNTER — Other Ambulatory Visit: Payer: Self-pay

## 2024-02-12 ENCOUNTER — Other Ambulatory Visit: Payer: Self-pay

## 2024-02-14 ENCOUNTER — Other Ambulatory Visit: Payer: Self-pay

## 2024-02-16 ENCOUNTER — Other Ambulatory Visit (HOSPITAL_COMMUNITY): Payer: Self-pay

## 2024-02-16 ENCOUNTER — Other Ambulatory Visit (HOSPITAL_BASED_OUTPATIENT_CLINIC_OR_DEPARTMENT_OTHER): Payer: Self-pay

## 2024-02-16 MED ORDER — HYDROXYZINE HCL 25 MG PO TABS
25.0000 mg | ORAL_TABLET | Freq: Four times a day (QID) | ORAL | 1 refills | Status: DC | PRN
  Filled 2024-02-16 – 2024-02-19 (×5): qty 30, 8d supply, fill #0
  Filled 2024-05-07: qty 30, 8d supply, fill #1

## 2024-02-16 MED ORDER — LIDOCAINE 5 % EX OINT
1.0000 | TOPICAL_OINTMENT | Freq: Two times a day (BID) | CUTANEOUS | 5 refills | Status: AC
  Filled 2024-02-16: qty 35.44, 18d supply, fill #0
  Filled 2024-02-16 – 2024-04-02 (×4): qty 100, 50d supply, fill #0
  Filled 2024-04-18 – 2024-05-10 (×3): qty 100, 50d supply, fill #1

## 2024-02-16 MED ORDER — VEOZAH 45 MG PO TABS
45.0000 mg | ORAL_TABLET | Freq: Every day | ORAL | 3 refills | Status: AC
  Filled 2024-02-16 – 2024-02-19 (×3): qty 30, 30d supply, fill #0

## 2024-02-16 MED ORDER — OXYCODONE-ACETAMINOPHEN 10-325 MG PO TABS
1.0000 | ORAL_TABLET | Freq: Every day | ORAL | 0 refills | Status: DC
  Filled 2024-02-16 – 2024-02-20 (×5): qty 150, 30d supply, fill #0
  Filled ????-??-??: fill #0

## 2024-02-17 ENCOUNTER — Other Ambulatory Visit (HOSPITAL_COMMUNITY): Payer: Self-pay

## 2024-02-19 ENCOUNTER — Other Ambulatory Visit: Payer: Self-pay

## 2024-02-19 ENCOUNTER — Other Ambulatory Visit (HOSPITAL_COMMUNITY): Payer: Self-pay

## 2024-02-19 ENCOUNTER — Other Ambulatory Visit (HOSPITAL_BASED_OUTPATIENT_CLINIC_OR_DEPARTMENT_OTHER): Payer: Self-pay

## 2024-02-19 MED ORDER — VEOZAH 45 MG PO TABS
45.0000 mg | ORAL_TABLET | Freq: Every day | ORAL | 3 refills | Status: AC
  Filled 2024-02-19 – 2024-02-29 (×3): qty 30, 30d supply, fill #0
  Filled 2024-05-16 – 2024-05-17 (×4): qty 30, 30d supply, fill #1

## 2024-02-20 ENCOUNTER — Other Ambulatory Visit (HOSPITAL_BASED_OUTPATIENT_CLINIC_OR_DEPARTMENT_OTHER): Payer: Self-pay

## 2024-02-20 ENCOUNTER — Other Ambulatory Visit (HOSPITAL_COMMUNITY): Payer: Self-pay

## 2024-02-21 ENCOUNTER — Other Ambulatory Visit (HOSPITAL_BASED_OUTPATIENT_CLINIC_OR_DEPARTMENT_OTHER): Payer: Self-pay

## 2024-02-21 ENCOUNTER — Other Ambulatory Visit: Payer: Self-pay

## 2024-02-21 ENCOUNTER — Other Ambulatory Visit (HOSPITAL_COMMUNITY): Payer: Self-pay

## 2024-02-27 ENCOUNTER — Other Ambulatory Visit (HOSPITAL_BASED_OUTPATIENT_CLINIC_OR_DEPARTMENT_OTHER): Payer: Self-pay

## 2024-02-29 ENCOUNTER — Other Ambulatory Visit (HOSPITAL_BASED_OUTPATIENT_CLINIC_OR_DEPARTMENT_OTHER): Payer: Self-pay

## 2024-02-29 ENCOUNTER — Other Ambulatory Visit (HOSPITAL_COMMUNITY): Payer: Self-pay

## 2024-03-06 ENCOUNTER — Other Ambulatory Visit (HOSPITAL_COMMUNITY): Payer: Self-pay

## 2024-03-06 ENCOUNTER — Other Ambulatory Visit (HOSPITAL_BASED_OUTPATIENT_CLINIC_OR_DEPARTMENT_OTHER): Payer: Self-pay

## 2024-03-06 MED ORDER — OMNIPOD 5 DEXG7G6 PODS GEN 5 MISC
3 refills | Status: AC
  Filled 2024-03-06 – 2024-04-02 (×2): qty 30, 90d supply, fill #0

## 2024-03-07 ENCOUNTER — Other Ambulatory Visit: Payer: Self-pay

## 2024-03-11 ENCOUNTER — Other Ambulatory Visit: Payer: Self-pay

## 2024-03-12 ENCOUNTER — Other Ambulatory Visit: Payer: Self-pay

## 2024-03-14 ENCOUNTER — Other Ambulatory Visit (HOSPITAL_BASED_OUTPATIENT_CLINIC_OR_DEPARTMENT_OTHER): Payer: Self-pay

## 2024-03-14 ENCOUNTER — Other Ambulatory Visit: Payer: Self-pay

## 2024-03-15 ENCOUNTER — Other Ambulatory Visit (HOSPITAL_BASED_OUTPATIENT_CLINIC_OR_DEPARTMENT_OTHER): Payer: Self-pay

## 2024-03-15 ENCOUNTER — Other Ambulatory Visit: Payer: Self-pay

## 2024-03-15 MED ORDER — ZOLPIDEM TARTRATE 10 MG PO TABS
10.0000 mg | ORAL_TABLET | Freq: Every day | ORAL | 5 refills | Status: AC
  Filled 2024-03-15: qty 30, 30d supply, fill #0
  Filled 2024-04-18: qty 30, 30d supply, fill #1

## 2024-03-15 MED ORDER — OXYCODONE-ACETAMINOPHEN 10-325 MG PO TABS
1.0000 | ORAL_TABLET | Freq: Every day | ORAL | 0 refills | Status: DC
  Filled 2024-03-20 (×2): qty 150, 30d supply, fill #0

## 2024-03-18 ENCOUNTER — Other Ambulatory Visit (HOSPITAL_BASED_OUTPATIENT_CLINIC_OR_DEPARTMENT_OTHER): Payer: Self-pay

## 2024-03-18 ENCOUNTER — Other Ambulatory Visit (HOSPITAL_COMMUNITY): Payer: Self-pay

## 2024-03-18 ENCOUNTER — Other Ambulatory Visit: Payer: Self-pay

## 2024-03-18 MED ORDER — CETIRIZINE HCL 10 MG PO TABS
10.0000 mg | ORAL_TABLET | Freq: Every day | ORAL | 3 refills | Status: AC
  Filled 2024-03-18: qty 90, 90d supply, fill #0
  Filled 2024-04-12: qty 30, 30d supply, fill #0

## 2024-03-19 ENCOUNTER — Other Ambulatory Visit: Payer: Self-pay

## 2024-03-19 ENCOUNTER — Other Ambulatory Visit (HOSPITAL_BASED_OUTPATIENT_CLINIC_OR_DEPARTMENT_OTHER): Payer: Self-pay

## 2024-03-20 ENCOUNTER — Other Ambulatory Visit (HOSPITAL_BASED_OUTPATIENT_CLINIC_OR_DEPARTMENT_OTHER): Payer: Self-pay

## 2024-03-20 ENCOUNTER — Other Ambulatory Visit (HOSPITAL_COMMUNITY): Payer: Self-pay

## 2024-03-20 ENCOUNTER — Other Ambulatory Visit: Payer: Self-pay

## 2024-03-21 ENCOUNTER — Other Ambulatory Visit: Payer: Self-pay

## 2024-03-21 ENCOUNTER — Other Ambulatory Visit (HOSPITAL_BASED_OUTPATIENT_CLINIC_OR_DEPARTMENT_OTHER): Payer: Self-pay

## 2024-03-30 ENCOUNTER — Emergency Department (HOSPITAL_BASED_OUTPATIENT_CLINIC_OR_DEPARTMENT_OTHER)
Admission: EM | Admit: 2024-03-30 | Discharge: 2024-03-30 | Disposition: A | Discharge status: Home / Self Care | Attending: Emergency Medicine | Admitting: Emergency Medicine

## 2024-03-30 ENCOUNTER — Emergency Department (HOSPITAL_BASED_OUTPATIENT_CLINIC_OR_DEPARTMENT_OTHER)

## 2024-03-30 ENCOUNTER — Encounter (HOSPITAL_BASED_OUTPATIENT_CLINIC_OR_DEPARTMENT_OTHER): Payer: Self-pay | Admitting: Emergency Medicine

## 2024-03-30 ENCOUNTER — Other Ambulatory Visit: Payer: Self-pay

## 2024-03-30 DIAGNOSIS — E119 Type 2 diabetes mellitus without complications: Secondary | ICD-10-CM | POA: Diagnosis not present

## 2024-03-30 DIAGNOSIS — I10 Essential (primary) hypertension: Secondary | ICD-10-CM | POA: Insufficient documentation

## 2024-03-30 DIAGNOSIS — R1084 Generalized abdominal pain: Secondary | ICD-10-CM | POA: Insufficient documentation

## 2024-03-30 DIAGNOSIS — Z794 Long term (current) use of insulin: Secondary | ICD-10-CM | POA: Insufficient documentation

## 2024-03-30 LAB — URINALYSIS, ROUTINE W REFLEX MICROSCOPIC
Bilirubin Urine: NEGATIVE
Glucose, UA: NEGATIVE mg/dL
Hgb urine dipstick: NEGATIVE
Ketones, ur: NEGATIVE mg/dL
Leukocytes,Ua: NEGATIVE
Nitrite: NEGATIVE
Protein, ur: NEGATIVE mg/dL
Specific Gravity, Urine: 1.02 (ref 1.005–1.030)
pH: 7 (ref 5.0–8.0)

## 2024-03-30 LAB — CBC WITH DIFFERENTIAL/PLATELET
Abs Immature Granulocytes: 0.01 K/uL (ref 0.00–0.07)
Basophils Absolute: 0 K/uL (ref 0.0–0.1)
Basophils Relative: 0 %
Eosinophils Absolute: 0.7 K/uL — ABNORMAL HIGH (ref 0.0–0.5)
Eosinophils Relative: 7 %
HCT: 38.6 % (ref 36.0–46.0)
Hemoglobin: 12.6 g/dL (ref 12.0–15.0)
Immature Granulocytes: 0 %
Lymphocytes Relative: 42 %
Lymphs Abs: 3.9 K/uL (ref 0.7–4.0)
MCH: 23.6 pg — ABNORMAL LOW (ref 26.0–34.0)
MCHC: 32.6 g/dL (ref 30.0–36.0)
MCV: 72.4 fL — ABNORMAL LOW (ref 80.0–100.0)
Monocytes Absolute: 0.7 K/uL (ref 0.1–1.0)
Monocytes Relative: 7 %
Neutro Abs: 4 K/uL (ref 1.7–7.7)
Neutrophils Relative %: 44 %
Platelets: 403 K/uL — ABNORMAL HIGH (ref 150–400)
RBC: 5.33 MIL/uL — ABNORMAL HIGH (ref 3.87–5.11)
RDW: 14.7 % (ref 11.5–15.5)
WBC: 9.3 K/uL (ref 4.0–10.5)
nRBC: 0 % (ref 0.0–0.2)

## 2024-03-30 LAB — COMPREHENSIVE METABOLIC PANEL WITH GFR
ALT: 14 U/L (ref 0–44)
AST: 16 U/L (ref 15–41)
Albumin: 4 g/dL (ref 3.5–5.0)
Alkaline Phosphatase: 61 U/L (ref 38–126)
Anion gap: 11 (ref 5–15)
BUN: 6 mg/dL (ref 6–20)
CO2: 24 mmol/L (ref 22–32)
Calcium: 9.3 mg/dL (ref 8.9–10.3)
Chloride: 102 mmol/L (ref 98–111)
Creatinine, Ser: 0.75 mg/dL (ref 0.44–1.00)
GFR, Estimated: >60 mL/min (ref >60)
Glucose, Bld: 170 mg/dL — ABNORMAL HIGH (ref 70–99)
Potassium: 3.7 mmol/L (ref 3.5–5.1)
Sodium: 137 mmol/L (ref 135–145)
Total Bilirubin: 0.3 mg/dL (ref 0.0–1.2)
Total Protein: 7.2 g/dL (ref 6.5–8.1)

## 2024-03-30 LAB — LIPASE, BLOOD: Lipase: 11 U/L (ref 11–51)

## 2024-03-30 MED ORDER — SODIUM CHLORIDE 0.9 % IV BOLUS
500.0000 mL | Freq: Once | INTRAVENOUS | Status: AC
  Administered 2024-03-30: 500 mL via INTRAVENOUS

## 2024-03-30 MED ORDER — LORAZEPAM 2 MG/ML IJ SOLN
1.0000 mg | Freq: Once | INTRAMUSCULAR | Status: AC
  Administered 2024-03-30: 1 mg via INTRAVENOUS
  Filled 2024-03-30: qty 1

## 2024-03-30 MED ORDER — DICYCLOMINE HCL 20 MG PO TABS: 20.0000 mg | ORAL_TABLET | Freq: Every day | ORAL | 0 refills | Status: AC

## 2024-03-30 MED ORDER — IOHEXOL 300 MG/ML  SOLN
100.0000 mL | Freq: Once | INTRAMUSCULAR | Status: AC | PRN
  Administered 2024-03-30: 100 mL via INTRAVENOUS

## 2024-03-30 MED ORDER — MORPHINE SULFATE (PF) 4 MG/ML IV SOLN
4.0000 mg | Freq: Once | INTRAVENOUS | Status: AC
Start: 2024-03-30 — End: 2024-03-30
  Administered 2024-03-30: 4 mg via INTRAVENOUS
  Filled 2024-03-30: qty 1

## 2024-03-30 MED ORDER — DICYCLOMINE HCL 10 MG PO CAPS
10.0000 mg | ORAL_CAPSULE | Freq: Once | ORAL | Status: AC
  Administered 2024-03-30: 10 mg via ORAL
  Filled 2024-03-30: qty 1

## 2024-03-30 NOTE — ED Provider Notes (Signed)
 [place] [facility] Provider Note   CSN: [id number] Arrival date & time: 03/30/24  8196     Patient presents with: Abdominal Pain and Diarrhea   Danielle Warren is a 48 y.o. female.   48 year old female with a past medical history of hypertension, fibromyalgia, type 2 diabetes, presents to the ED with a chief complaint of periumbilical abdominal pain for the past 3 days.  Patient reports an aching sensation throughout her entire abdomen.  No focal point of tenderness.  She has been trying to take some over-the-counter medication without any improvement in symptoms.  She is also try to take some Zofran  without any improvement.  She has had multiple episodes of nonbilious, nonbloody emesis.  She did try some nausea suppositories but reports she immediately had diarrhea after this.  She does endorse daily marijuana use, whether is smoking this or eating Gummies.  She tells me that her last oral intake was 3 days ago.  Got alarmed today as she noticed some blood in the toilet bowl, this appeared to be bright red, she does have a prior history of underlying anemia.  Reports that she has not been able to take her medication for chronic pain due to the nausea and vomiting.  She has had no fever, no sick contacts, no urinary symptoms.  The history is provided by the patient.  Abdominal Pain Pain location:  Periumbilical Pain quality: aching   Pain radiates to:  Does not radiate Pain severity:  Severe Duration:  3 days Timing:  Constant Progression:  Worsening Chronicity:  Recurrent Context: not alcohol  use and not eating   Relieved by:  Nothing Worsened by:  Nothing Associated symptoms: diarrhea and vomiting   Associated symptoms: no chest pain, no chills, no fever, no shortness of breath and no sore throat   Diarrhea Associated symptoms: abdominal pain and vomiting   Associated symptoms: no chills, no fever and no headaches        Prior to  Admission medications   Medication Sig Start Date End Date Taking? Authorizing Provider  dicyclomine (BENTYL) 20 MG tablet Take 1 tablet (20 mg total) by mouth daily for 7 days. 03/30/24 04/06/24 Yes Kayleana Waites, PA-C  albuterol  (VENTOLIN  HFA) 108 (90 Base) MCG/ACT inhaler Inhale into the lungs. Patient not taking: Reported on 01/15/2021    [provider]  albuterol  (VENTOLIN  HFA) 108 (90 Base) MCG/ACT inhaler Inhale 1 puff into the lungs every 4 (four) hours. 08/16/23     Alcohol  Swabs  (EASY TOUCH ALCOHOL  PREP MEDIUM) 70 % PADS Use 1 pad 2 (two) times daily as directed by provider. 08/23/23     amitriptyline  (ELAVIL ) 50 MG tablet Take 1 tablet (50 mg total) by mouth at bedtime. 01/15/21   Raulkar, Sven SQUIBB, MD  amLODipine  (NORVASC ) 10 MG tablet Take 10 mg by mouth daily.  Patient not taking: Reported on 01/15/2021 09/03/19   [provider]  amLODipine  (NORVASC ) 10 MG tablet Take 1 tablet (10 mg total) by mouth daily. 08/23/23     atorvastatin  (LIPITOR) 10 MG tablet Take 1 tablet (10 mg total) by mouth at bedtime. 07/18/23     atorvastatin  (LIPITOR) 20 MG tablet Take 1 tablet (20 mg total) by mouth at bedtime. 01/17/24     atorvastatin  (LIPITOR) 40 MG tablet Take 40 mg by mouth at bedtime. Patient not taking: Reported on 01/15/2021 12/04/19   [provider]  BD VEO INSULIN  SYRINGE U/F 31G X 15/64 0.5 ML MISC  12/11/19   [provider]  budesonide -formoterol  (SYMBICORT ) 160-4.5 MCG/ACT inhaler Inhale 2 puffs into the lungs 2 (two) times daily. 09/04/23     budesonide -formoterol  (SYMBICORT ) 160-4.5 MCG/ACT inhaler Inhale 2 puffs into the lungs 2 (two) times daily. 01/31/24     celecoxib  (CELEBREX ) 200 MG capsule Take 1 capsule (200 mg total) by mouth daily. 12/20/23     cetirizine  (ZYRTEC ) 10 MG tablet Take 10 mg by mouth daily. Patient not taking: Reported on 01/15/2021 11/28/19   [provider]  cetirizine  (ZYRTEC ) 10 MG tablet Take 1 tablet (10 mg total) by mouth  daily. 12/20/23     cetirizine  (ZYRTEC ) 10 MG tablet Take 1 tablet (10 mg total) by mouth daily. 03/18/24     Continuous Blood Gluc Receiver (FREESTYLE LIBRE 14 DAY READER) DEVI SMARTSIG:1 Topical Every Night Patient not taking: Reported on 01/15/2021 01/10/20   [provider]  Continuous Blood Gluc Sensor (FREESTYLE LIBRE 14 DAY SENSOR) MISC Sensor on Left arm Patient not taking: Reported on 01/15/2021 01/10/20   [provider]  Continuous Glucose Receiver (DEXCOM G7 RECEIVER) DEVI use as directed 08/02/23     Continuous Glucose Sensor (DEXCOM G7 SENSOR) MISC Use as directed and change every 10 days. 08/05/23     Continuous Glucose Sensor (DEXCOM G7 SENSOR) MISC Use 1 sensor every 10 days. 12/20/23     cyclobenzaprine  (FLEXERIL ) 10 MG tablet SMARTSIG:1 Tablet(s) By Mouth 1 to 3 Times Daily Patient not taking: Reported on 01/15/2021 10/24/19   [provider]  cyclobenzaprine  (FLEXERIL ) 10 MG tablet Take 1 tablet (10 mg total) by mouth 3 (three) times daily as needed for muscle spasm. 12/20/23     dapagliflozin  propanediol (FARXIGA ) 5 MG TABS tablet Take 1 tablet (5 mg total) by mouth daily. 09/05/23     diazepam  (VALIUM ) 10 MG tablet Take 1 tablet (10 mg total) by mouth 1 hour before procedure 09/27/23     diclofenac  (FLECTOR ) 1.3 % PTCH Place 1 patch onto the skin every 12 (twelve) hours for 12 hours as needed. 08/07/23     diclofenac  (FLECTOR ) 1.3 % PTCH Place 1 patch onto the skin every 12 (twelve) hours as needed. 12/20/23     diclofenac  Sodium (VOLTAREN ) 1 % GEL Apply 2-4 g topically to the affected area 2-4 times daily as needed for pain. 04/04/23     diphenhydrAMINE  (BENADRYL ) 25 mg capsule Take 1 capsule (25 mg total) by mouth every 6 (six) hours as needed (headache along with compazine ). Patient not taking: Reported on 01/15/2021 03/21/14   Raenelle Donalda HERO, MD  DULoxetine  (CYMBALTA ) 30 MG capsule Take 30 mg by mouth 2 (two) times daily.  Patient not taking: Reported on  01/15/2021 12/05/19   [provider]  DULoxetine  (CYMBALTA ) 30 MG capsule Take 2 capsules (60 mg total) by mouth daily. 08/23/23     ergocalciferol  (VITAMIN D2) 1.25 MG (50000 UT) capsule Take 1 capsule (50,000 Units total) by mouth once a week. 03/03/23     Ferrous Sulfate  (IRON ) 325 (65 Fe) MG TABS Take 1 tablet (325 mg total) by mouth daily. 12/20/23     ferrous sulfate  325 (65 FE) MG EC tablet Take 1 tablet (325 mg total) by mouth daily. 05/30/23     Fezolinetant  (VEOZAH ) 45 MG TABS Take 1 tablet (45 mg total) by mouth daily. 02/16/24     Fezolinetant  (VEOZAH ) 45 MG TABS Take 1 tablet (45 mg total) by mouth daily. 02/16/24     FLECTOR  1.3 %  PTCH Place 1 patch onto the skin as needed.  Patient not taking: Reported on 01/15/2021 11/04/19   [provider]  fluconazole  (DIFLUCAN ) 150 MG tablet Take 150 mg by mouth as needed.  Patient not taking: Reported on 01/15/2021 06/30/16   [provider]  fluconazole  (DIFLUCAN ) 150 MG tablet Take 1 tablet (150 mg total) by mouth daily. 12/08/22     fluticasone  (FLONASE ) 50 MCG/ACT nasal spray Place 2 sprays into both nostrils daily. 12/20/23     gabapentin  (NEURONTIN ) 600 MG tablet Take 1 tablet (600 mg total) by mouth 3 (three) times daily. 03/10/21   Raulkar, Krutika P, MD  glipiZIDE (GLUCOTROL) 10 MG tablet Take 20-30 mg by mouth daily.  Patient not taking: Reported on 01/15/2021 09/19/19   [provider]  HYDROcodone -acetaminophen  (NORCO) 5-325 MG tablet Take 1-2 tablets by mouth every 6 (six) hours as needed. 06/24/22   Harris, Abigail, PA-C  hydrOXYzine  (ATARAX ) 25 MG tablet Take 1 tablet (25 mg total) by mouth every 6 (six) hours as needed for itching. 02/16/24     hyoscyamine  (LEVSIN  SL) 0.125 MG SL tablet Place 1 tablet (0.125 mg total) under the tongue every 4 (four) hours as needed. Patient not taking: Reported on 01/15/2021 01/28/20   Cirigliano, Vito V, DO  Insulin  Disposable Pump (OMNIPOD 5 DEXG7G6 PODS GEN 5) MISC Use 1 pod  every 3 days. 03/06/24     insulin  glargine (LANTUS ) 100 UNIT/ML Solostar Pen Inject 20 Units into the skin 2 (two) times daily. 06/12/23     insulin  lispro (HUMALOG ) 100 UNIT/ML injection Infuse up to 75 units daily through insulin  pump. 11/09/23     insulin  lispro (HUMALOG ) 100 UNIT/ML KwikPen Inject 6 Units into the skin 3 (three) times daily before meals with SSI of 1:50. Max daily dose of 50 units a day. 09/05/23     Insulin  Pen Needle 31G X 5 MM MISC Use as directed 3 times daily with insulin . 02/16/23     LEVEMIR 100 UNIT/ML injection Inject into the skin. 17units in the morning and 14 units at night Patient not taking: Reported on 01/15/2021 12/04/19   [provider]  lidocaine  (LIDODERM ) 5 % Unwrap and place 1 patch onto the skin daily for 10 days. Remove & discard patch within 12 hours or as directed by MD 07/17/23     lidocaine  (XYLOCAINE ) 5 % ointment Apply 1 gram topically 2 (two) times daily. 12/20/23     lidocaine  (XYLOCAINE ) 5 % ointment Apply 1 Application (1 grams) topically 2 (two) times daily. 02/16/24     linaclotide  (LINZESS ) 72 MCG capsule Take 1 capsule (72 mcg total) by mouth daily for constipation. 01/08/24     losartan  (COZAAR ) 100 MG tablet Take 1 tablet (100 mg total) by mouth daily. 01/05/23     losartan  (COZAAR ) 100 MG tablet Take 1 tablet (100 mg total) by mouth daily. 08/23/23     losartan -hydrochlorothiazide (HYZAAR) 100-12.5 MG tablet Take 1 tablet by mouth daily. Patient not taking: Reported on 01/15/2021 07/02/20   [provider]  losartan -hydrochlorothiazide (HYZAAR) 50-12.5 MG tablet Take 1 tablet by mouth daily. 05/27/22 06/26/22  Zelaya, Oscar A, PA-C  metoprolol  tartrate (LOPRESSOR ) 25 MG tablet Take 1 tablet (25 mg total) by mouth 2 (two) times daily. 01/09/20 10/01/20  Mona Vinie BROCKS, MD  metoprolol  tartrate (LOPRESSOR ) 25 MG tablet Take 1 tablet by mouth daily 08/23/23     metoprolol  tartrate (LOPRESSOR ) 25 MG tablet Take 1 tablet (25 mg total) by  mouth  daily. 12/20/23     mirtazapine  (REMERON ) 15 MG tablet Take 7.5 mg by mouth daily. Patient not taking: Reported on 01/15/2021 12/19/19   [provider]  mirtazapine  (REMERON ) 30 MG tablet Take 1 tablet (30 mg total) by mouth at bedtime. 06/27/23     mirtazapine  (REMERON ) 30 MG tablet Take 1 tablet (30 mg total) by mouth daily at bedtime. 06/29/23     mirtazapine  (REMERON ) 30 MG tablet Take 1 tablet (30 mg total) by mouth at bedtime. 12/20/23     montelukast  (SINGULAIR ) 10 MG tablet Take 1 tablet (10 mg total) by mouth daily. 06/29/23     montelukast  (SINGULAIR ) 10 MG tablet Take 1 tablet (10 mg total) by mouth daily. 03/03/23     mupirocin ointment (BACTROBAN) 2 % SMARTSIG:Sparingly Topical 3 Times Daily PRN Patient not taking: Reported on 01/15/2021 12/04/19   [provider]  naloxone  (NARCAN ) 4 MG/0.1ML LIQD nasal spray kit  09/25/19   [provider]  naloxone  (NARCAN ) nasal spray 4 mg/0.1 mL Call 911. Spray contents of 1 sprayer (0.16ml) into 1 nostril. Repeat in 2-3 mins if symptoms of opiod emergency persist. Alternate nostrils 04/28/23     naloxone  (NARCAN ) nasal spray 4 mg/0.1 mL Call 911. Spray contents of one sprayer (0.1 ml) into 1 nostril. Repeat in 2 to 3 minutes if symptoms of opiod emergency persist. Alternate nostrils. 05/31/23     nicotine  (NICODERM CQ  - DOSED IN MG/24 HOURS) 14 mg/24hr patch Place 1 patch (14 mg total) onto the skin daily for 24 hours. apply to hairless skin every day, remove old patch before placing new one. 04/01/23     omeprazole  (PRILOSEC) 20 MG capsule Take 1 capsule (20 mg total) by mouth 2 (two) times daily before a meal. After 4 weeks may decrease to 20 mg  once a day 30 min prior to eating. Patient not taking: Reported on 01/15/2021 02/10/20   Cirigliano, Vito V, DO  ondansetron  (ZOFRAN ) 4 MG tablet Take 4 mg by mouth as needed.  Patient not taking: Reported on 01/15/2021 08/15/19   [provider]  ondansetron  (ZOFRAN ) 4 MG tablet  Take 1 tablet (4 mg total) by mouth every 8 (eight) hours as needed. 12/20/23     oxyCODONE  (ROXICODONE ) 5 MG immediate release tablet Take 1 tablet (5 mg total) by mouth 2 (two) times daily as needed for severe pain. 01/25/21   Raulkar, Sven SQUIBB, MD  oxyCODONE -acetaminophen  (PERCOCET) 10-325 MG tablet Take 1 tablet by mouth 5 (five) times daily. 09/27/23     oxyCODONE -acetaminophen  (PERCOCET) 10-325 MG tablet Take 1 tablet by mouth 5 (five) times daily. 03/15/24     oxyCODONE -acetaminophen  (PERCOCET/ROXICET) 5-325 MG tablet Take 1 tablet by mouth every 6 (six) hours as needed for severe pain. 05/27/22   Zelaya, Oscar A, PA-C  OZEMPIC, 0.25 OR 0.5 MG/DOSE, 2 MG/1.5ML SOPN Inject into the skin once a week.  Patient not taking: Reported on 01/15/2021 12/13/19   [provider]  pantoprazole  (PROTONIX ) 40 MG tablet Take 1 tablet (40 mg total) by mouth 2 (two) times daily. Take 20-30 minutes before breakfast and dinner. 08/23/23     pantoprazole  (PROTONIX ) 40 MG tablet Take 1 tablet (40 mg total) by mouth 2 (two) times daily 20-30 minutes before breakfast and dinner. 12/20/23     PHENADOZ 25 MG suppository INSERT 1 SUPPOSITORY RECTALLY EVERY 6 HOURS AS NEEDED FOR 10 DAYS Patient not taking: Reported on 01/15/2021 11/21/19   [provider]  pregabalin  (LYRICA ) 150 MG capsule Take 1 capsule (150 mg total) by mouth 2 (two) times daily. 08/07/23     pregabalin  (LYRICA ) 150 MG capsule Take 1 capsule (150 mg total) by mouth 2 (two) times daily. 08/23/23     pregabalin  (LYRICA ) 150 MG capsule Take 1 capsule (150 mg total) by mouth 2 (two) times daily. 12/20/23     tirzepatide  (MOUNJARO ) 7.5 MG/0.5ML Pen Inject 7.5 mg into the skin once a week. 03/03/23     Vitamin D , Ergocalciferol , (DRISDOL ) 1.25 MG (50000 UNIT) CAPS capsule Take 1 capsule (50,000 Units total) by mouth once a week. 08/23/23     Vitamin D , Ergocalciferol , (DRISDOL ) 1.25 MG (50000 UNIT) CAPS capsule Take 1 capsule (50,000 Units total) by  mouth once a week. 09/04/23     zolpidem  (AMBIEN ) 10 MG tablet Take 1 tablet (10 mg total) by mouth at bedtime. 03/15/24     famotidine  (PEPCID ) 20 MG tablet Take 1 tablet (20 mg total) by mouth 2 (two) times daily. 08/23/23 01/30/24      Allergies: Amoxicillin-pot clavulanate, Cephalosporins, Doxycycline, Macrolides and ketolides, Nitrofurantoin, Penicillins, Sulfa antibiotics, Sulfamethoxazole-trimethoprim, Sulfasalazine, Amoxicillin, Elemental sulfur , and Telbivudine    Review of Systems  Constitutional:  Negative for chills and fever.  HENT:  Negative for sore throat.   Respiratory:  Negative for shortness of breath.   Cardiovascular:  Negative for chest pain.  Gastrointestinal:  Positive for abdominal pain, diarrhea and vomiting.  Genitourinary:  Negative for flank pain.  Musculoskeletal:  Negative for back pain.  Skin:  Negative for pallor and wound.  Neurological:  Negative for light-headedness and headaches.  All other systems reviewed and are negative.   Updated Vital Signs BP (!) 116/91   Pulse 98   Temp 98 F (36.7 C) (Oral)   Resp 17   Ht 5' 1 (1.549 m)   Wt 72.6 kg   LMP 02/04/2011   SpO2 99%   BMI 30.23 kg/m   Physical Exam Vitals and nursing note reviewed.  Constitutional:      Appearance: She is well-developed.  HENT:     Head: Normocephalic and atraumatic.  Cardiovascular:     Rate and Rhythm: Normal rate.  Pulmonary:     Effort: Pulmonary effort is normal.  Abdominal:     General: Abdomen is flat.     Palpations: Abdomen is soft.     Tenderness: There is generalized abdominal tenderness.     Comments: Bowel sounds are present.  Skin:    General: Skin is warm and dry.  Neurological:     Mental Status: She is alert and oriented to person, place, and time.     (all labs ordered are listed, but only abnormal results are displayed) Labs Reviewed  CBC WITH DIFFERENTIAL/PLATELET - Abnormal; Notable for the following components:      Result Value    RBC 5.33 (*)    MCV 72.4 (*)    MCH 23.6 (*)    Platelets 403 (*)    Eosinophils Absolute 0.7 (*)    All other components within normal limits  COMPREHENSIVE METABOLIC PANEL WITH GFR - Abnormal; Notable for the following components:   Glucose, Bld 170 (*)    All other components within normal limits  LIPASE, BLOOD  URINALYSIS, ROUTINE W REFLEX MICROSCOPIC  OCCULT BLOOD X 1 CARD TO LAB, STOOL    EKG: None  Radiology: CT ABDOMEN PELVIS W CONTRAST Result Date: 03/30/2024 EXAM: CT ABDOMEN AND PELVIS WITH CONTRAST 03/30/2024 09:10:00 PM  TECHNIQUE: CT of the abdomen and pelvis was performed with the administration of 100 mL of iohexol  (OMNIPAQUE ) 300 MG/ML solution. Multiplanar reformatted images are provided for review. Automated exposure control, iterative reconstruction, and/or weight-based adjustment of the mA/kV was utilized to reduce the radiation dose to as low as reasonably achievable. COMPARISON: Comparison with 01/26/2022. CLINICAL HISTORY: Lower GI bleed. FINDINGS: LOWER CHEST: No acute abnormality. 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 GALLBLADDER AND BILE DUCTS: Cholecystectomy. No biliary ductal dilatation. SPLEEN: No acute abnormality. PANCREAS: No acute abnormality. ADRENAL GLANDS: No acute abnormality. KIDNEYS, URETERS AND BLADDER: No stones in the kidneys or ureters. No hydronephrosis. No perinephric or periureteral stranding. Urinary bladder is unremarkable. GI AND BOWEL: Stomach demonstrates no acute abnormality. Normal appendix. There is no bowel obstruction. Possible mild wall thickening of the descending and sigmoid colon versus under distension. PERITONEUM AND RETROPERITONEUM: No ascites. No free air. VASCULATURE: Aorta is normal in caliber. Aortic atherosclerotic calcification. LYMPH NODES: No lymphadenopathy. REPRODUCTIVE ORGANS: Hysterectomy. 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 BONES AND SOFT TISSUES: Sacroiliac stimulator. No acute  osseous abnormality. No focal soft tissue abnormality. IMPRESSION: 1. Possible mild descending and sigmoid colitis versus under distention. No evidence of active bleeding on single phase evaluation. Electronically signed by: Norman Gatlin MD 03/30/2024 09:23 PM EST RP Workstation: [id number]     Procedures   Medications Ordered in the ED  LORazepam (ATIVAN) injection 1 mg (1 mg Intravenous Given 03/30/24 1853)  sodium chloride  0.9 % bolus 500 mL (0 mLs Intravenous Stopped 03/30/24 2005)  morphine  (PF) 4 MG/ML injection 4 mg (4 mg Intravenous Given 03/30/24 2008)  dicyclomine (BENTYL) capsule 10 mg (10 mg Oral Given 03/30/24 2040)  iohexol  (OMNIPAQUE ) 300 MG/ML solution 100 mL (100 mLs Intravenous Contrast Given 03/30/24 2115)                                    Medical Decision Making Amount and/or Complexity of Data Reviewed Labs: ordered. Radiology: ordered.  Risk Prescription drug management.   This patient presents to the ED for concern of abdominal pain, diarrhea, this involves a number of treatment options, and is a complaint that carries with it a high risk of complications and morbidity.  The differential diagnosis includes diverticulitis, lower GI bleed, hemorrhoids versus hyperemesis cannabinoid.   Co morbidities: Discussed in HPI   Brief History:  See HPI.  EMR reviewed including pt PMHx, past surgical history and past visits to ER.   See HPI for more details  Lab Tests:  I ordered and independently interpreted labs.  The pertinent results include:    I personally reviewed all laboratory work and imaging. Metabolic panel without any acute abnormality specifically kidney function within normal limits and no significant electrolyte abnormalities. CBC without leukocytosis or significant anemia.  Imaging Studies:  CT Abdomen pelvis with contrast showed:    Medicines ordered:  I ordered medication including morphine , Bentyl, Ativan for symptomatic  treatment Reevaluation of the patient after these medicines showed that the patient improved I have reviewed the patients home medicines and have made adjustments as needed  Reevaluation:  After the interventions noted above I re-evaluated patient and found that they have :improved  Social Determinants of Health:  The patient's social determinants of health were a factor in the care of this patient  Problem List / ED Course:  Patient presented to the ED with a chief complaint  of abdominal pain, diarrhea which has been ongoing for the last couple days, she reports being concerned as there was some blood in her stool.  She tells me that the blood was in the toilet bowl and it was bright red.  She also endorses generalized abdominal pain, has not been able to keep food down.  She tells me that she does use daily THC Gummies.  Urinalysis without any nitrites, or leukocytes.  Lipase level is within normal limits.  Serial abdominal exams remain benign.  She was given initially Ativan  which did not help much with her pain however did help with her nausea.  She was also given some morphine  along with some fluids.  She does report improvement in her symptoms.  Abdomen remains soft.  Patient was tolerating p.o., however did discuss her case with her daughter and they warrant CT abdomen and pelvis at this time. Patient's hemoglobin is stable, she does not have any history of anticoagulants according to her medical records.  I do not suspect lower GI bleed at this time.  CT abdomen and pelvis was obtained which is within normal limits.  She has not had any episodes of hypotension while in the ED to suggest diverticular bleed.  She denies any dizziness or lightheadedness.  With a stable hemoglobin and a reassuring abdominal series along with CT abdomen and pelvis I do not feel that patient warrants any further emergent workup at this time.  Patient did tell me that she does take Linzess , she thinks this could be  attributing to the diarrhea however sometimes it does not work for her and after several days then the diarrhea begins.  She has not had any episodes of bloody diarrhea while in the ED.  Discussed results with patient along with provided with a copy of her CT abdomen and pelvis, she is hemodynamically stable for discharge.   Dispostion:  After consideration of the diagnostic results and the patients response to treatment, I feel that the patent would benefit from discontinue THC use, follow-up with primary care physician.    Portions of this note were generated with Scientist, clinical (histocompatibility and immunogenetics). Dictation errors may occur despite best attempts at proofreading.   Final diagnoses:  Generalized abdominal pain    ED Discharge Orders          Ordered    dicyclomine  (BENTYL ) 20 MG tablet  Daily        03/30/24 2139               Chesley Veasey, PA-C 03/30/24 2139    Butler, Michael C, MD 03/31/24 [phone]

## 2024-03-30 NOTE — ED Triage Notes (Signed)
 Pt reports abd pain and diarrhea since Wed; sts noticed blood in diarrhea today

## 2024-03-30 NOTE — Discharge Instructions (Signed)
 Discussed the results of your CT scan on today's visit.  Your hemoglobin was normal today.  Please follow-up with your primary care physician or to further evaluate your ongoing bleeding.  If you experience any lightheadedness, dizziness he will need to return to the emergency department.  I have also sent a prescription for Bentyl to your pharmacy, please take this as prescribed.

## 2024-04-02 ENCOUNTER — Other Ambulatory Visit: Payer: Self-pay

## 2024-04-02 ENCOUNTER — Other Ambulatory Visit (HOSPITAL_BASED_OUTPATIENT_CLINIC_OR_DEPARTMENT_OTHER): Payer: Self-pay

## 2024-04-10 ENCOUNTER — Other Ambulatory Visit (HOSPITAL_BASED_OUTPATIENT_CLINIC_OR_DEPARTMENT_OTHER): Payer: Self-pay

## 2024-04-10 MED ORDER — DICYCLOMINE HCL 20 MG PO TABS
20.0000 mg | ORAL_TABLET | Freq: Three times a day (TID) | ORAL | 0 refills | Status: AC
  Filled 2024-04-10: qty 30, 10d supply, fill #0

## 2024-04-10 MED ORDER — CIPROFLOXACIN HCL 500 MG PO TABS
500.0000 mg | ORAL_TABLET | Freq: Two times a day (BID) | ORAL | 0 refills | Status: AC
  Filled 2024-04-10: qty 14, 7d supply, fill #0

## 2024-04-12 ENCOUNTER — Other Ambulatory Visit: Payer: Self-pay

## 2024-04-14 ENCOUNTER — Other Ambulatory Visit: Payer: Self-pay

## 2024-04-15 ENCOUNTER — Other Ambulatory Visit (HOSPITAL_BASED_OUTPATIENT_CLINIC_OR_DEPARTMENT_OTHER): Payer: Self-pay

## 2024-04-15 ENCOUNTER — Other Ambulatory Visit: Payer: Self-pay

## 2024-04-15 MED ORDER — OXYCODONE-ACETAMINOPHEN 10-325 MG PO TABS
1.0000 | ORAL_TABLET | ORAL | 0 refills | Status: AC
  Filled 2024-04-17: qty 150, 30d supply, fill #0

## 2024-04-15 MED ORDER — OXYCODONE-ACETAMINOPHEN 10-325 MG PO TABS: 1.0000 | ORAL_TABLET | ORAL | 0 refills | Status: AC

## 2024-04-16 ENCOUNTER — Other Ambulatory Visit: Payer: Self-pay

## 2024-04-17 ENCOUNTER — Other Ambulatory Visit: Payer: Self-pay

## 2024-04-17 ENCOUNTER — Emergency Department (HOSPITAL_BASED_OUTPATIENT_CLINIC_OR_DEPARTMENT_OTHER)

## 2024-04-17 ENCOUNTER — Telehealth (HOSPITAL_BASED_OUTPATIENT_CLINIC_OR_DEPARTMENT_OTHER): Payer: Self-pay | Admitting: Emergency Medicine

## 2024-04-17 ENCOUNTER — Other Ambulatory Visit (HOSPITAL_BASED_OUTPATIENT_CLINIC_OR_DEPARTMENT_OTHER): Payer: Self-pay

## 2024-04-17 ENCOUNTER — Emergency Department (HOSPITAL_BASED_OUTPATIENT_CLINIC_OR_DEPARTMENT_OTHER): Admission: EM | Admit: 2024-04-17 | Discharge: 2024-04-17 | Disposition: A | Discharge status: Home / Self Care

## 2024-04-17 ENCOUNTER — Encounter (HOSPITAL_BASED_OUTPATIENT_CLINIC_OR_DEPARTMENT_OTHER): Payer: Self-pay | Admitting: Emergency Medicine

## 2024-04-17 DIAGNOSIS — Z794 Long term (current) use of insulin: Secondary | ICD-10-CM | POA: Insufficient documentation

## 2024-04-17 DIAGNOSIS — R0789 Other chest pain: Secondary | ICD-10-CM | POA: Diagnosis not present

## 2024-04-17 DIAGNOSIS — I10 Essential (primary) hypertension: Secondary | ICD-10-CM | POA: Insufficient documentation

## 2024-04-17 DIAGNOSIS — R0602 Shortness of breath: Secondary | ICD-10-CM | POA: Diagnosis present

## 2024-04-17 DIAGNOSIS — J45909 Unspecified asthma, uncomplicated: Secondary | ICD-10-CM | POA: Diagnosis not present

## 2024-04-17 DIAGNOSIS — R000 Tachycardia, unspecified: Secondary | ICD-10-CM | POA: Diagnosis not present

## 2024-04-17 DIAGNOSIS — E119 Type 2 diabetes mellitus without complications: Secondary | ICD-10-CM | POA: Diagnosis not present

## 2024-04-17 LAB — COMPREHENSIVE METABOLIC PANEL WITH GFR
ALT: 18 U/L (ref 0–44)
AST: 18 U/L (ref 15–41)
Albumin: 4.2 g/dL (ref 3.5–5.0)
Alkaline Phosphatase: 53 U/L (ref 38–126)
Anion gap: 16 — ABNORMAL HIGH (ref 5–15)
BUN: 10 mg/dL (ref 6–20)
CO2: 21 mmol/L — ABNORMAL LOW (ref 22–32)
Calcium: 9 mg/dL (ref 8.9–10.3)
Chloride: 100 mmol/L (ref 98–111)
Creatinine, Ser: 0.77 mg/dL (ref 0.44–1.00)
GFR, Estimated: >60 mL/min (ref >60)
Glucose, Bld: 219 mg/dL — ABNORMAL HIGH (ref 70–99)
Potassium: 3.8 mmol/L (ref 3.5–5.1)
Sodium: 137 mmol/L (ref 135–145)
Total Bilirubin: 0.6 mg/dL (ref 0.0–1.2)
Total Protein: 7.6 g/dL (ref 6.5–8.1)

## 2024-04-17 LAB — CBC WITH DIFFERENTIAL/PLATELET
Abs Immature Granulocytes: 0.03 K/uL (ref 0.00–0.07)
Basophils Absolute: 0.1 K/uL (ref 0.0–0.1)
Basophils Relative: 1 %
Eosinophils Absolute: 0.2 K/uL (ref 0.0–0.5)
Eosinophils Relative: 3 %
HCT: 39.7 % (ref 36.0–46.0)
Hemoglobin: 12.8 g/dL (ref 12.0–15.0)
Immature Granulocytes: 0 %
Lymphocytes Relative: 35 %
Lymphs Abs: 2.7 K/uL (ref 0.7–4.0)
MCH: 23.6 pg — ABNORMAL LOW (ref 26.0–34.0)
MCHC: 32.2 g/dL (ref 30.0–36.0)
MCV: 73.2 fL — ABNORMAL LOW (ref 80.0–100.0)
Monocytes Absolute: 0.6 K/uL (ref 0.1–1.0)
Monocytes Relative: 7 %
Neutro Abs: 4.2 K/uL (ref 1.7–7.7)
Neutrophils Relative %: 54 %
Platelets: 450 K/uL — ABNORMAL HIGH (ref 150–400)
RBC: 5.42 MIL/uL — ABNORMAL HIGH (ref 3.87–5.11)
RDW: 14.6 % (ref 11.5–15.5)
WBC: 7.8 K/uL (ref 4.0–10.5)
nRBC: 0 % (ref 0.0–0.2)

## 2024-04-17 LAB — CBG MONITORING, ED: Glucose-Capillary: 226 mg/dL — ABNORMAL HIGH (ref 70–99)

## 2024-04-17 LAB — PRO BRAIN NATRIURETIC PEPTIDE: Pro Brain Natriuretic Peptide: 53 pg/mL (ref <300.0)

## 2024-04-17 LAB — TROPONIN T, HIGH SENSITIVITY: Troponin T High Sensitivity: <15 ng/L (ref 0–19)

## 2024-04-17 MED ORDER — ATORVASTATIN CALCIUM 20 MG PO TABS
20.0000 mg | ORAL_TABLET | Freq: Every evening | ORAL | 0 refills | Status: AC
  Filled 2024-04-17: qty 30, 30d supply, fill #0
  Filled 2024-04-17: qty 90, 90d supply, fill #0
  Filled 2024-05-13: qty 30, 30d supply, fill #1

## 2024-04-17 MED ORDER — OXYCODONE HCL 5 MG PO TABS
10.0000 mg | ORAL_TABLET | Freq: Once | ORAL | Status: AC
  Administered 2024-04-17: 12:00:00 10 mg via ORAL
  Filled 2024-04-17: qty 2

## 2024-04-17 MED ORDER — IPRATROPIUM-ALBUTEROL 0.5-2.5 (3) MG/3ML IN SOLN
3.0000 mL | Freq: Once | RESPIRATORY_TRACT | Status: AC
  Administered 2024-04-17: 11:00:00 3 mL via RESPIRATORY_TRACT
  Filled 2024-04-17: qty 3

## 2024-04-17 MED ORDER — DULOXETINE HCL 30 MG PO CPEP
60.0000 mg | ORAL_CAPSULE | Freq: Every day | ORAL | 6 refills | Status: AC
  Filled 2024-04-17 (×2): qty 60, 30d supply, fill #0
  Filled 2024-05-13: qty 60, 30d supply, fill #1

## 2024-04-17 MED ORDER — IOHEXOL 350 MG/ML SOLN
100.0000 mL | Freq: Once | INTRAVENOUS | Status: AC | PRN
  Administered 2024-04-17: 12:00:00 75 mL via INTRAVENOUS

## 2024-04-17 MED ORDER — IPRATROPIUM-ALBUTEROL 0.5-2.5 (3) MG/3ML IN SOLN
3.0000 mL | Freq: Once | RESPIRATORY_TRACT | Status: AC
  Administered 2024-04-17: 13:00:00 3 mL via RESPIRATORY_TRACT
  Filled 2024-04-17: qty 3

## 2024-04-17 MED ORDER — INSULIN ASPART PROT & ASPART (70-30 MIX) 100 UNIT/ML ~~LOC~~ SUSP
4.0000 [IU] | Freq: Once | SUBCUTANEOUS | Status: AC
  Administered 2024-04-17: 13:00:00 4 [IU] via SUBCUTANEOUS
  Filled 2024-04-17: qty 4

## 2024-04-17 MED ORDER — ALBUTEROL SULFATE (2.5 MG/3ML) 0.083% IN NEBU
2.5000 mg | INHALATION_SOLUTION | Freq: Four times a day (QID) | RESPIRATORY_TRACT | 12 refills | Status: AC | PRN
  Filled 2024-04-17: qty 90, 8d supply, fill #0
  Filled 2024-05-07: qty 75, 7d supply, fill #1

## 2024-04-17 NOTE — Telephone Encounter (Signed)
 There was a problem with the discharge prescription of a nebulizer.  Will try to send to appropriate pharmacy.

## 2024-04-17 NOTE — ED Notes (Signed)
 Patient states she has worsening SOB when laying down. States she took a neb and her inhalers yesterday and they helped some but worse today. Breath sounds are clear/diminished bilaterally. No wheezing noted. RN in to triage.

## 2024-04-17 NOTE — ED Notes (Signed)
 Ambulated patient (with cane) SpO2 remaining 99%, HR at highest of 123, throughout our walk. Patient back in bed, resting comfortably. RN aware.

## 2024-04-17 NOTE — ED Triage Notes (Addendum)
 Dizzy and sob since yesterday got a breathing tx  that was her   grandchilds yesterday  and then she had cp all started yesterday , feels worse when she lays down  had some vomiting and diarrhea on monday

## 2024-04-17 NOTE — Inpatient Diabetes Management (Signed)
 Inpatient Diabetes Program Recommendations  AACE/ADA: New Consensus Statement on Inpatient Glycemic Control   Target Ranges:  Prepandial:   less than 140 mg/dL      Peak postprandial:   less than 180 mg/dL (1-2 hours)      Critically ill patients:  140 - 180 mg/dL   Lab Results  Component Value Date   GLUCAP 226 (H) 04/17/2024   HGBA1C 8.0 (H) 03/20/2014    Latest Reference Range & Units 04/17/24 13:54  Glucose-Capillary 70 - 99 mg/dL 773 (H)   Review of Glycemic Control  Diabetes history: DM1  Outpatient Diabetes medications:  Dexcom G7 Omnipod 5 ([place])  Sees Endo Carden, NP for diabetes management  Per Endo note: *Back up plan:* Tresiba 20 units, Lispro 6 units plus SSI   Current orders for Inpatient glycemic control:  None Was given Novolog  4 units at 1317    Inpatient Diabetes Program Recommendations:   Noted patient has Type 1 diabetes (per Endo note on 03/18/24, patient has 1.5 diabetes)   Called and spoke with patient over the phone. Confirmed patient's home medications for diabetes management (Dexcom G7 and Omnipod 5 - Auto Mode). Patient states she took her pump off this morning and did not feel like putting a new one on.   Since patient has Type 1 diabetes, will require Long acting and Short Acting insulin .   If patient remains inpatient, please consider ordering Lantus  15 units daily and Novolog  0-6 units Q4HRS.   I made patient aware if she is discharged home this evening and not given basal insulin , she should immediately apply new pump when she gets home.  Patient was very appreciative of information and had no further questions at this time.   Thanks,  Lavanda Search, RN, MSN, [facility]  Inpatient Diabetes Coordinator  Pager [phone] (8a-5p)

## 2024-04-17 NOTE — ED Notes (Signed)
 Assisted patient to the bathroom, no incident or issue noted.

## 2024-04-17 NOTE — ED Notes (Addendum)
 Script for [employer] originally sent to [facility] pharmacy. [facility] does not provide DME, recommended prescription sent to [facility], but they were unable to provide. EDP able to provide script for DME and faxed to [facility] as requested. Pt notified

## 2024-04-17 NOTE — ED Notes (Signed)
 Pt advised she's an active smoker, has been not able to smoke for several days. Pt doesn't have a nebulizer at home anymore, and advised it worked better than her MDI. She plans to buy another for the house. No recent illness but has been having hot sweats overnight and soaking her sheets and coverings.

## 2024-04-17 NOTE — ED Provider Notes (Signed)
 [place] [facility] Provider Note   CSN: [id number] Arrival date & time: 04/17/24  1005     Patient presents with: Shortness of Breath   Danielle Warren is a 48 y.o. female.   Is a 48 year old female presenting emergency department with shortness of breath.  Acutely worsened last night.  Tried family members nebulizer with improvement of her symptoms.  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  Reports worsening symptoms overnight and into this morning.  Reports she is unable to lie flat.  On further questioning she has been having some dyspnea on exertion for few weeks.  She also reports that a week or so ago she had significant swelling to her left leg greater than the right but has improved.  Denies history of CHF.  She is also complaining of some left-sided chest discomfort that she describes as a pressure.  Worsened with deep breathing.  Also worsened with palpation   Shortness of Breath      Prior to Admission medications  Medication Sig Start Date End Date Taking? Authorizing Provider  albuterol  (PROVENTIL ) (5 MG/ML) 0.5% nebulizer solution Take 0.5 mLs (2.5 mg total) by nebulization every 6 (six) hours as needed for wheezing or shortness of breath. 04/17/24  Yes Neysa Caron PARAS, DO  dapagliflozin  propanediol (FARXIGA ) 10 MG TABS tablet Take 10 mg by mouth daily. 11/02/23  Yes [provider]  diclofenac  (VOLTAREN ) 75 MG EC tablet Take 75 mg by mouth 2 (two) times daily. 03/05/24  Yes [provider]  levofloxacin (LEVAQUIN) 500 MG tablet Take 500 mg by mouth daily. 04/03/24  Yes [provider]  metroNIDAZOLE (FLAGYL) 500 MG tablet Take 500 mg by mouth 2 (two) times daily. 04/04/24  Yes [provider]  albuterol  (VENTOLIN  HFA) 108 (90 Base) MCG/ACT inhaler Inhale into the lungs. Patient not taking: Reported on 01/15/2021    [provider]  albuterol  (VENTOLIN   HFA) 108 (90 Base) MCG/ACT inhaler Inhale 1 puff into the lungs every 4 (four) hours. 08/16/23     Alcohol  Swabs  (EASY TOUCH ALCOHOL  PREP MEDIUM) 70 % PADS Use 1 pad 2 (two) times daily as directed by provider. 08/23/23     amitriptyline  (ELAVIL ) 50 MG tablet Take 1 tablet (50 mg total) by mouth at bedtime. 01/15/21   Raulkar, Sven SQUIBB, MD  amLODipine  (NORVASC ) 10 MG tablet Take 10 mg by mouth daily.  Patient not taking: Reported on 01/15/2021 09/03/19   [provider]  amLODipine  (NORVASC ) 10 MG tablet Take 1 tablet (10 mg total) by mouth daily. 08/23/23     atorvastatin  (LIPITOR) 10 MG tablet Take 1 tablet (10 mg total) by mouth at bedtime. 07/18/23     atorvastatin  (LIPITOR) 20 MG tablet Take 1 tablet (20 mg total) by mouth at bedtime. 01/17/24     atorvastatin  (LIPITOR) 40 MG tablet Take 40 mg by mouth at bedtime. Patient not taking: Reported on 01/15/2021 12/04/19   [provider]  BD VEO INSULIN  SYRINGE U/F 31G X 15/64 0.5 ML MISC  12/11/19   [provider]  budesonide -formoterol  (SYMBICORT ) 160-4.5 MCG/ACT inhaler Inhale 2 puffs into the lungs 2 (two) times daily. 09/04/23     budesonide -formoterol  (SYMBICORT ) 160-4.5 MCG/ACT inhaler Inhale 2 puffs into the lungs 2 (two) times daily. 01/31/24     celecoxib  (CELEBREX ) 200 MG capsule Take 1 capsule (200 mg total) by mouth daily. 12/20/23     cetirizine  (ZYRTEC )  10 MG tablet Take 10 mg by mouth daily. Patient not taking: Reported on 01/15/2021 11/28/19   [provider]  cetirizine  (ZYRTEC ) 10 MG tablet Take 1 tablet (10 mg total) by mouth daily. 12/20/23     cetirizine  (ZYRTEC ) 10 MG tablet Take 1 tablet (10 mg total) by mouth daily. 03/18/24     ciprofloxacin  (CIPRO ) 500 MG tablet Take 1 tablet (500 mg total) by mouth 2 (two) times daily. 04/10/24     Continuous Blood Gluc Receiver (FREESTYLE LIBRE 14 DAY READER) DEVI SMARTSIG:1 Topical Every Night Patient not taking: Reported on 01/15/2021 01/10/20   [provider]  Continuous Blood Gluc Sensor (FREESTYLE LIBRE 14 DAY SENSOR) MISC Sensor on Left arm Patient not taking: Reported on 01/15/2021 01/10/20   [provider]  Continuous Glucose Receiver (DEXCOM G7 RECEIVER) DEVI use as directed 08/02/23     Continuous Glucose Sensor (DEXCOM G7 SENSOR) MISC Use as directed and change every 10 days. 08/05/23     Continuous Glucose Sensor (DEXCOM G7 SENSOR) MISC Use 1 sensor every 10 days. 12/20/23     cyclobenzaprine  (FLEXERIL ) 10 MG tablet SMARTSIG:1 Tablet(s) By Mouth 1 to 3 Times Daily Patient not taking: Reported on 01/15/2021 10/24/19   [provider]  cyclobenzaprine  (FLEXERIL ) 10 MG tablet Take 1 tablet (10 mg total) by mouth 3 (three) times daily as needed for muscle spasm. 12/20/23     dapagliflozin  propanediol (FARXIGA ) 5 MG TABS tablet Take 1 tablet (5 mg total) by mouth daily. 09/05/23     diazepam  (VALIUM ) 10 MG tablet Take 1 tablet (10 mg total) by mouth 1 hour before procedure 09/27/23     diclofenac  (FLECTOR ) 1.3 % PTCH Place 1 patch onto the skin every 12 (twelve) hours for 12 hours as needed. 08/07/23     diclofenac  (FLECTOR ) 1.3 % PTCH Place 1 patch onto the skin every 12 (twelve) hours as needed. 12/20/23     diclofenac  Sodium (VOLTAREN ) 1 % GEL Apply 2-4 g topically to the affected area 2-4 times daily as needed for pain. 04/04/23     dicyclomine  (BENTYL ) 20 MG tablet Take 1 tablet (20 mg total) by mouth daily for 7 days. 03/30/24 04/06/24  Soto, Johana, PA-C  dicyclomine  (BENTYL ) 20 MG tablet Take 1 tablet (20 mg total) by mouth 3 (three) times daily. 04/10/24     diphenhydrAMINE  (BENADRYL ) 25 mg capsule Take 1 capsule (25 mg total) by mouth every 6 (six) hours as needed (headache along with compazine ). Patient not taking: Reported on 01/15/2021 03/21/14   Raenelle Donalda HERO, MD  DULoxetine  (CYMBALTA ) 30 MG capsule Take 30 mg by mouth 2 (two) times daily.  Patient not taking: Reported on 01/15/2021 12/05/19   [provider]   DULoxetine  (CYMBALTA ) 30 MG capsule Take 2 capsules (60 mg total) by mouth daily. 08/23/23     ergocalciferol  (VITAMIN D2) 1.25 MG (50000 UT) capsule Take 1 capsule (50,000 Units total) by mouth once a week. 03/03/23     Ferrous Sulfate  (IRON ) 325 (65 Fe) MG TABS Take 1 tablet (325 mg total) by mouth daily. 12/20/23     ferrous sulfate  325 (65 FE) MG EC tablet Take 1 tablet (325 mg total) by mouth daily. 05/30/23     Fezolinetant  (VEOZAH ) 45 MG TABS Take 1 tablet (45 mg total) by mouth daily. 02/16/24     Fezolinetant  (VEOZAH ) 45 MG TABS Take 1 tablet (45 mg total) by mouth daily. 02/16/24     FLECTOR  1.3 %  PTCH Place 1 patch onto the skin as needed.  Patient not taking: Reported on 01/15/2021 11/04/19   [provider]  fluconazole  (DIFLUCAN ) 150 MG tablet Take 150 mg by mouth as needed.  Patient not taking: Reported on 01/15/2021 06/30/16   [provider]  fluconazole  (DIFLUCAN ) 150 MG tablet Take 1 tablet (150 mg total) by mouth daily. 12/08/22     fluticasone  (FLONASE ) 50 MCG/ACT nasal spray Place 2 sprays into both nostrils daily. 12/20/23     gabapentin  (NEURONTIN ) 600 MG tablet Take 1 tablet (600 mg total) by mouth 3 (three) times daily. 03/10/21   Raulkar, Krutika P, MD  glipiZIDE (GLUCOTROL) 10 MG tablet Take 20-30 mg by mouth daily.  Patient not taking: Reported on 01/15/2021 09/19/19   [provider]  HYDROcodone -acetaminophen  (NORCO) 5-325 MG tablet Take 1-2 tablets by mouth every 6 (six) hours as needed. 06/24/22   Harris, Abigail, PA-C  hydrOXYzine  (ATARAX ) 25 MG tablet Take 1 tablet (25 mg total) by mouth every 6 (six) hours as needed for itching. 02/16/24     hyoscyamine  (LEVSIN  SL) 0.125 MG SL tablet Place 1 tablet (0.125 mg total) under the tongue every 4 (four) hours as needed. Patient not taking: Reported on 01/15/2021 01/28/20   Cirigliano, Vito V, DO  Insulin  Disposable Pump (OMNIPOD 5 DEXG7G6 PODS GEN 5) MISC Use 1 pod every 3 days. 03/06/24     insulin  glargine  (LANTUS ) 100 UNIT/ML Solostar Pen Inject 20 Units into the skin 2 (two) times daily. 06/12/23     insulin  lispro (HUMALOG ) 100 UNIT/ML injection Infuse up to 75 units daily through insulin  pump. 11/09/23     insulin  lispro (HUMALOG ) 100 UNIT/ML KwikPen Inject 6 Units into the skin 3 (three) times daily before meals with SSI of 1:50. Max daily dose of 50 units a day. 09/05/23     Insulin  Pen Needle 31G X 5 MM MISC Use as directed 3 times daily with insulin . 02/16/23     LEVEMIR 100 UNIT/ML injection Inject into the skin. 17units in the morning and 14 units at night Patient not taking: Reported on 01/15/2021 12/04/19   [provider]  lidocaine  (LIDODERM ) 5 % Unwrap and place 1 patch onto the skin daily for 10 days. Remove & discard patch within 12 hours or as directed by MD 07/17/23     lidocaine  (XYLOCAINE ) 5 % ointment Apply 1 gram topically 2 (two) times daily. 12/20/23     lidocaine  (XYLOCAINE ) 5 % ointment Apply 1 Application (1 grams) topically 2 (two) times daily. 02/16/24     linaclotide  (LINZESS ) 72 MCG capsule Take 1 capsule (72 mcg total) by mouth daily for constipation. 01/08/24     losartan  (COZAAR ) 100 MG tablet Take 1 tablet (100 mg total) by mouth daily. 01/05/23     losartan  (COZAAR ) 100 MG tablet Take 1 tablet (100 mg total) by mouth daily. 08/23/23     losartan -hydrochlorothiazide (HYZAAR) 100-12.5 MG tablet Take 1 tablet by mouth daily. Patient not taking: Reported on 01/15/2021 07/02/20   [provider]  losartan -hydrochlorothiazide (HYZAAR) 50-12.5 MG tablet Take 1 tablet by mouth daily. 05/27/22 06/26/22  Zelaya, Oscar A, PA-C  metoprolol  tartrate (LOPRESSOR ) 25 MG tablet Take 1 tablet (25 mg total) by mouth 2 (two) times daily. 01/09/20 10/01/20  Mona Vinie BROCKS, MD  metoprolol  tartrate (LOPRESSOR ) 25 MG tablet Take 1 tablet by mouth daily 08/23/23     metoprolol  tartrate (LOPRESSOR ) 25 MG tablet Take 1 tablet (25 mg total) by  mouth daily. 12/20/23     mirtazapine  (REMERON ) 15 MG  tablet Take 7.5 mg by mouth daily. Patient not taking: Reported on 01/15/2021 12/19/19   [provider]  mirtazapine  (REMERON ) 30 MG tablet Take 1 tablet (30 mg total) by mouth at bedtime. 06/27/23     mirtazapine  (REMERON ) 30 MG tablet Take 1 tablet (30 mg total) by mouth daily at bedtime. 06/29/23     mirtazapine  (REMERON ) 30 MG tablet Take 1 tablet (30 mg total) by mouth at bedtime. 12/20/23     montelukast  (SINGULAIR ) 10 MG tablet Take 1 tablet (10 mg total) by mouth daily. 06/29/23     montelukast  (SINGULAIR ) 10 MG tablet Take 1 tablet (10 mg total) by mouth daily. 03/03/23     mupirocin ointment (BACTROBAN) 2 % SMARTSIG:Sparingly Topical 3 Times Daily PRN Patient not taking: Reported on 01/15/2021 12/04/19   [provider]  naloxone  (NARCAN ) 4 MG/0.1ML LIQD nasal spray kit  09/25/19   [provider]  naloxone  (NARCAN ) nasal spray 4 mg/0.1 mL Call 911. Spray contents of 1 sprayer (0.68ml) into 1 nostril. Repeat in 2-3 mins if symptoms of opiod emergency persist. Alternate nostrils 04/28/23     naloxone  (NARCAN ) nasal spray 4 mg/0.1 mL Call 911. Spray contents of one sprayer (0.1 ml) into 1 nostril. Repeat in 2 to 3 minutes if symptoms of opiod emergency persist. Alternate nostrils. 05/31/23     nicotine  (NICODERM CQ  - DOSED IN MG/24 HOURS) 14 mg/24hr patch Place 1 patch (14 mg total) onto the skin daily for 24 hours. apply to hairless skin every day, remove old patch before placing new one. 04/01/23     omeprazole  (PRILOSEC) 20 MG capsule Take 1 capsule (20 mg total) by mouth 2 (two) times daily before a meal. After 4 weeks may decrease to 20 mg  once a day 30 min prior to eating. Patient not taking: Reported on 01/15/2021 02/10/20   Cirigliano, Vito V, DO  ondansetron  (ZOFRAN ) 4 MG tablet Take 4 mg by mouth as needed.  Patient not taking: Reported on 01/15/2021 08/15/19   [provider]  ondansetron  (ZOFRAN ) 4 MG tablet Take 1 tablet (4 mg total) by mouth every 8  (eight) hours as needed. 12/20/23     oxyCODONE  (ROXICODONE ) 5 MG immediate release tablet Take 1 tablet (5 mg total) by mouth 2 (two) times daily as needed for severe pain. 01/25/21   Raulkar, Sven SQUIBB, MD  oxyCODONE -acetaminophen  (PERCOCET) 10-325 MG tablet Take 1 tablet by mouth 5 (five) times daily. 09/27/23     oxyCODONE -acetaminophen  (PERCOCET) 10-325 MG tablet Take 1 tablet by mouth five times daily 04/15/24     oxyCODONE -acetaminophen  (PERCOCET) 10-325 MG tablet Take 1 tablet by mouth five times daily. 04/15/24     oxyCODONE -acetaminophen  (PERCOCET/ROXICET) 5-325 MG tablet Take 1 tablet by mouth every 6 (six) hours as needed for severe pain. 05/27/22   Zelaya, Oscar A, PA-C  OZEMPIC, 0.25 OR 0.5 MG/DOSE, 2 MG/1.5ML SOPN Inject into the skin once a week.  Patient not taking: Reported on 01/15/2021 12/13/19   [provider]  pantoprazole  (PROTONIX ) 40 MG tablet Take 1 tablet (40 mg total) by mouth 2 (two) times daily. Take 20-30 minutes before breakfast and dinner. 08/23/23     pantoprazole  (PROTONIX ) 40 MG tablet Take 1 tablet (40 mg total) by mouth 2 (two) times daily 20-30 minutes before breakfast and dinner. 12/20/23     PHENADOZ 25 MG suppository INSERT 1 SUPPOSITORY RECTALLY EVERY 6 HOURS AS  NEEDED FOR 10 DAYS Patient not taking: Reported on 01/15/2021 11/21/19   [provider]  pregabalin  (LYRICA ) 150 MG capsule Take 1 capsule (150 mg total) by mouth 2 (two) times daily. 08/07/23     pregabalin  (LYRICA ) 150 MG capsule Take 1 capsule (150 mg total) by mouth 2 (two) times daily. 08/23/23     pregabalin  (LYRICA ) 150 MG capsule Take 1 capsule (150 mg total) by mouth 2 (two) times daily. 12/20/23     tirzepatide  (MOUNJARO ) 7.5 MG/0.5ML Pen Inject 7.5 mg into the skin once a week. 03/03/23     Vitamin D , Ergocalciferol , (DRISDOL ) 1.25 MG (50000 UNIT) CAPS capsule Take 1 capsule (50,000 Units total) by mouth once a week. 08/23/23     Vitamin D , Ergocalciferol , (DRISDOL ) 1.25 MG (50000  UNIT) CAPS capsule Take 1 capsule (50,000 Units total) by mouth once a week. 09/04/23     zolpidem  (AMBIEN ) 10 MG tablet Take 1 tablet (10 mg total) by mouth at bedtime. 03/15/24     famotidine  (PEPCID ) 20 MG tablet Take 1 tablet (20 mg total) by mouth 2 (two) times daily. 08/23/23 01/30/24      Allergies: Amoxicillin-pot clavulanate, Cephalosporins, Doxycycline, Macrolides and ketolides, Nitrofurantoin, Penicillins, Sulfa antibiotics, Sulfamethoxazole-trimethoprim, Sulfasalazine, Amoxicillin, Elemental sulfur , and Telbivudine    Review of Systems  Respiratory:  Positive for shortness of breath.     Updated Vital Signs BP (!) 160/73   Pulse (!) 101   Temp 97.7 F (36.5 C) (Oral)   Resp 17   Ht 5' 1 (1.549 m)   Wt 72.6 kg   LMP 02/04/2011   SpO2 100%   BMI 30.23 kg/m   Physical Exam Vitals and nursing note reviewed.  Constitutional:      General: She is not in acute distress. Cardiovascular:     Rate and Rhythm: Regular rhythm. Tachycardia present.  Pulmonary:     Effort: Pulmonary effort is normal.     Breath sounds: Decreased breath sounds present. No wheezing, rhonchi or rales.  Chest:     Chest wall: Tenderness present.  Musculoskeletal:     Cervical back: Normal range of motion.     Right lower leg: No edema.     Left lower leg: No edema.  Skin:    General: Skin is warm and dry.     Capillary Refill: Capillary refill takes less than 2 seconds.  Neurological:     Mental Status: She is alert and oriented to person, place, and time.  Psychiatric:        Mood and Affect: Mood normal.        Behavior: Behavior normal.     (all labs ordered are listed, but only abnormal results are displayed) Labs Reviewed  CBC WITH DIFFERENTIAL/PLATELET - Abnormal; Notable for the following components:      Result Value   RBC 5.42 (*)    MCV 73.2 (*)    MCH 23.6 (*)    Platelets 450 (*)    All other components within normal limits  COMPREHENSIVE METABOLIC PANEL WITH GFR -  Abnormal; Notable for the following components:   CO2 21 (*)    Glucose, Bld 219 (*)    Anion gap 16 (*)    All other components within normal limits  CBG MONITORING, ED - Abnormal; Notable for the following components:   Glucose-Capillary 226 (*)    All other components within normal limits  PRO BRAIN NATRIURETIC PEPTIDE  TROPONIN T, HIGH SENSITIVITY    EKG: EKG  Interpretation Date/Time:  Wednesday April 17 2024 10:18:03 EST Ventricular Rate:  104 PR Interval:  150 QRS Duration:  97 QT Interval:  365 QTC Calculation: 481 R Axis:   22  Text Interpretation: Sinus tachycardia Anteroseptal infarct, age indeterminate Confirmed by Neysa Clap [phone]) on 04/17/2024 10:19:38 AM  Radiology: CT Angio Chest PE W and/or Wo Contrast Result Date: 04/17/2024 CLINICAL DATA:  Shortness of breath for 2 days EXAM: CT ANGIOGRAPHY CHEST WITH CONTRAST TECHNIQUE: Multidetector CT imaging of the chest was performed using the standard protocol during bolus administration of intravenous contrast. Multiplanar CT image reconstructions and MIPs were obtained to evaluate the vascular anatomy. RADIATION DOSE REDUCTION: This exam was performed according to the departmental dose-optimization program which includes automated exposure control, adjustment of the mA and/or kV according to patient size and/or use of iterative reconstruction technique. CONTRAST:  75mL OMNIPAQUE  IOHEXOL  350 MG/ML SOLN COMPARISON:  March 13, 2013 FINDINGS: Cardiovascular: Satisfactory opacification of the pulmonary arteries to the segmental level. No evidence of pulmonary embolism. Normal heart size. No pericardial effusion. Mediastinum/Nodes: 1.5 cm left thyroid nodule is noted. Esophagus is unremarkable. No adenopathy is noted. Lungs/Pleura: Lungs are clear. No pleural effusion or pneumothorax. Upper Abdomen: No acute abnormality. Musculoskeletal: No chest wall abnormality. No acute or significant osseous findings. Review of the MIP  images confirms the above findings. IMPRESSION: 1. No definite evidence of pulmonary embolus. 2. 1.5 cm left thyroid nodule is noted. Recommend thyroid US . (ref: J Am Coll Radiol. 2015 Feb;12(2): 143-50). Electronically Signed   By: Lynwood Landy Raddle M.D.   On: 04/17/2024 13:11   DG Chest Portable 1 View Result Date: 04/17/2024 EXAM: 1 VIEW(S) XRAY OF THE CHEST 04/17/2024 10:44:00 AM COMPARISON: PA and lateral radiographs of the chest dated 10/22/2015. CLINICAL HISTORY: SHOB FINDINGS: LUNGS AND PLEURA: No focal pulmonary opacity. No pleural effusion. No pneumothorax. HEART AND MEDIASTINUM: No acute abnormality of the cardiac and mediastinal silhouettes. BONES AND SOFT TISSUES: No acute osseous abnormality. Right upper abdomen cholecystectomy clips. IMPRESSION: 1. No acute process. Electronically signed by: Evalene Coho MD 04/17/2024 11:01 AM EST RP Workstation: [id number]     Procedures   Medications Ordered in the ED  ipratropium-albuterol  (DUONEB) 0.5-2.5 (3) MG/3ML nebulizer solution 3 mL (3 mLs Nebulization Given 04/17/24 1035)  iohexol  (OMNIPAQUE ) 350 MG/ML injection 100 mL (75 mLs Intravenous Contrast Given 04/17/24 1158)  oxyCODONE  (Oxy IR/ROXICODONE ) immediate release tablet 10 mg (10 mg Oral Given 04/17/24 1227)  insulin  aspart protamine- aspart (NOVOLOG  MIX 70/30) injection 4 Units (4 Units Subcutaneous Given 04/17/24 1317)  ipratropium-albuterol  (DUONEB) 0.5-2.5 (3) MG/3ML nebulizer solution 3 mL (3 mLs Nebulization Given 04/17/24 1240)    Clinical Course as of 04/17/24 1456  Wed Apr 17, 2024  1237 Patient reevaluated.  Does note significant improvement in her shortness of breath after breathing treatment.  Will give another DuoNeb. [TY]  1237 Glucose(!): 219 Insulin  ordered [TY]  1418 Patient feeling even better after second breathing treatment.  Her PE study is negative. [TY]  1443 Workup is reassuring.  Chest pain has been ongoing  for close to 24 hours.  Negative troponin and  reassuring EKG.  ACS unlikely.  proBNP negative.  Chest x-ray without decompensated heart failure.  Her PE scan was negative.  Also without evidence of pneumonia.  She has no leukocytosis to suggest systemic infection.  No anemia to explain her shortness of breath or symptoms.  She does feel greatly improved after 2 breathing treatments.  Ambulatory in the department without dropping  her oxygen saturation.  Will discharge in stable condition with refill of albuterol .  Patient given return precautions. [TY]    Clinical Course User Index [TY] Neysa Caron PARAS, DO                                 Medical Decision Making Is a 48 year old female complex past medical history to include obesity, hypertension, hyperlipidemia, fibromyalgia, diabetes, asthma, chronic back pain presenting emergency department for shortness of breath.  She is afebrile, but is mildly tachycardic and tachypneic.  Maintaining oxygen saturation on room air.  She is hypertensive as well.  Does not appear to be in overt respiratory distress.  She is speaking in full sentences and maintaining oxygen saturation.  Minor tachypnea noted.  Lungs with poor air movement, but did not appreciate wheezes or crackles.  She did feel some improvement last night with breathing treatment.  Will trial DuoNeb.  I am concerned given her history of asymmetric leg swelling now with chest pain and shortness of breath that she may have blood clots.  I do not appreciate asymmetry currently.  Will get CTA to evaluate for PE.  Her EKG appears to be normal sinus rhythm on my independent interpretation.  I do not appreciate ST segment changes that would indicate ischemia.  Given her complaint of chest pain will evaluate for ACS.  With her dyspnea on exertion, orthopnea possible heart failure?  Will get BNP, chest x-ray.  Amount and/or Complexity of Data Reviewed External Data Reviewed:     Details: No prior cardiac testing Labs: ordered. Decision-making details  documented in ED Course.    Details: See ED course Radiology: ordered and independent interpretation performed. Decision-making details documented in ED Course. ECG/medicine tests: independent interpretation performed.  Risk Prescription drug management. Decision regarding hospitalization. Diagnosis or treatment significantly limited by social determinants of health.       Final diagnoses:  Shortness of breath    ED Discharge Orders          Ordered    albuterol  (PROVENTIL ) (5 MG/ML) 0.5% nebulizer solution  Every 6 hours PRN        04/17/24 1446    For home use only DME Nebulizer machine        04/17/24 1446               Neysa Caron PARAS, DO 04/17/24 1456

## 2024-04-17 NOTE — ED Notes (Signed)

## 2024-04-17 NOTE — Discharge Instructions (Addendum)
 Please follow-up with your primary doctor.  Return immediately for fevers, chills, worsening chest pain, difficulty breathing, passout or any new or worsening symptoms that are concerning to you.

## 2024-04-18 ENCOUNTER — Other Ambulatory Visit (HOSPITAL_BASED_OUTPATIENT_CLINIC_OR_DEPARTMENT_OTHER): Payer: Self-pay

## 2024-04-19 ENCOUNTER — Other Ambulatory Visit (HOSPITAL_BASED_OUTPATIENT_CLINIC_OR_DEPARTMENT_OTHER): Payer: Self-pay

## 2024-04-19 ENCOUNTER — Other Ambulatory Visit: Payer: Self-pay

## 2024-04-24 ENCOUNTER — Other Ambulatory Visit (HOSPITAL_BASED_OUTPATIENT_CLINIC_OR_DEPARTMENT_OTHER): Payer: Self-pay

## 2024-04-30 ENCOUNTER — Other Ambulatory Visit (HOSPITAL_BASED_OUTPATIENT_CLINIC_OR_DEPARTMENT_OTHER): Payer: Self-pay

## 2024-04-30 ENCOUNTER — Other Ambulatory Visit (HOSPITAL_COMMUNITY): Payer: Self-pay

## 2024-05-01 ENCOUNTER — Other Ambulatory Visit: Payer: Self-pay

## 2024-05-06 ENCOUNTER — Other Ambulatory Visit (HOSPITAL_BASED_OUTPATIENT_CLINIC_OR_DEPARTMENT_OTHER): Payer: Self-pay

## 2024-05-06 ENCOUNTER — Other Ambulatory Visit (HOSPITAL_COMMUNITY): Payer: Self-pay

## 2024-05-07 ENCOUNTER — Other Ambulatory Visit (HOSPITAL_BASED_OUTPATIENT_CLINIC_OR_DEPARTMENT_OTHER): Payer: Self-pay

## 2024-05-13 ENCOUNTER — Other Ambulatory Visit: Payer: Self-pay

## 2024-05-14 ENCOUNTER — Other Ambulatory Visit (HOSPITAL_COMMUNITY): Payer: Self-pay

## 2024-05-15 ENCOUNTER — Other Ambulatory Visit: Payer: Self-pay

## 2024-05-15 ENCOUNTER — Other Ambulatory Visit (HOSPITAL_BASED_OUTPATIENT_CLINIC_OR_DEPARTMENT_OTHER): Payer: Self-pay

## 2024-05-15 ENCOUNTER — Other Ambulatory Visit (HOSPITAL_COMMUNITY): Payer: Self-pay

## 2024-05-15 MED ORDER — HYDROXYZINE HCL 25 MG PO TABS
25.0000 mg | ORAL_TABLET | Freq: Four times a day (QID) | ORAL | 6 refills | Status: AC | PRN
  Filled 2024-05-15 (×2): qty 30, 8d supply, fill #0

## 2024-05-15 MED ORDER — OXYCODONE-ACETAMINOPHEN 10-325 MG PO TABS
1.0000 | ORAL_TABLET | Freq: Every day | ORAL | 0 refills | Status: AC
  Filled 2024-05-15 (×2): qty 150, 30d supply, fill #0
  Filled 2024-05-16: qty 100, 20d supply, fill #0
  Filled 2024-05-16 (×2): qty 150, 30d supply, fill #0
  Filled 2024-05-16: qty 50, 10d supply, fill #0
  Filled ????-??-??: fill #0

## 2024-05-16 ENCOUNTER — Other Ambulatory Visit (HOSPITAL_COMMUNITY): Payer: Self-pay

## 2024-05-16 ENCOUNTER — Other Ambulatory Visit (HOSPITAL_BASED_OUTPATIENT_CLINIC_OR_DEPARTMENT_OTHER): Payer: Self-pay

## 2024-05-16 ENCOUNTER — Other Ambulatory Visit: Payer: Self-pay

## 2024-05-17 ENCOUNTER — Other Ambulatory Visit (HOSPITAL_BASED_OUTPATIENT_CLINIC_OR_DEPARTMENT_OTHER): Payer: Self-pay

## 2024-05-17 ENCOUNTER — Other Ambulatory Visit (HOSPITAL_COMMUNITY): Payer: Self-pay

## 2024-05-17 ENCOUNTER — Other Ambulatory Visit: Payer: Self-pay

## 2024-05-23 ENCOUNTER — Other Ambulatory Visit: Payer: Self-pay

## 2024-05-24 ENCOUNTER — Other Ambulatory Visit (HOSPITAL_COMMUNITY): Payer: Self-pay | Admitting: Family Medicine

## 2024-05-24 DIAGNOSIS — R079 Chest pain, unspecified: Secondary | ICD-10-CM

## 2024-06-02 ENCOUNTER — Other Ambulatory Visit: Payer: Self-pay

## 2024-06-03 ENCOUNTER — Other Ambulatory Visit (HOSPITAL_BASED_OUTPATIENT_CLINIC_OR_DEPARTMENT_OTHER): Payer: Self-pay

## 2024-06-03 ENCOUNTER — Telehealth (HOSPITAL_COMMUNITY): Payer: Self-pay | Admitting: Emergency Medicine

## 2024-06-03 MED ORDER — IVABRADINE HCL 5 MG PO TABS
15.0000 mg | ORAL_TABLET | Freq: Once | ORAL | 0 refills | Status: AC
  Filled 2024-06-03: qty 3, 2d supply, fill #0

## 2024-06-04 ENCOUNTER — Ambulatory Visit (HOSPITAL_BASED_OUTPATIENT_CLINIC_OR_DEPARTMENT_OTHER)

## 2024-06-04 ENCOUNTER — Other Ambulatory Visit: Payer: Self-pay

## 2024-06-18 ENCOUNTER — Ambulatory Visit (HOSPITAL_BASED_OUTPATIENT_CLINIC_OR_DEPARTMENT_OTHER)
